# Patient Record
Sex: Male | Born: 1955 | Race: White | Hispanic: No | Marital: Married | Smoking: Former smoker
Health system: Southern US, Community
[De-identification: ages and names within clinical notes are randomized; demographics above are authoritative.]

## PROBLEM LIST (undated history)

## (undated) DIAGNOSIS — G8929 Other chronic pain: Secondary | ICD-10-CM

## (undated) DIAGNOSIS — M542 Cervicalgia: Secondary | ICD-10-CM

## (undated) DIAGNOSIS — F1910 Other psychoactive substance abuse, uncomplicated: Secondary | ICD-10-CM

## (undated) DIAGNOSIS — K5790 Diverticulosis of intestine, part unspecified, without perforation or abscess without bleeding: Secondary | ICD-10-CM

## (undated) DIAGNOSIS — K831 Obstruction of bile duct: Secondary | ICD-10-CM

## (undated) DIAGNOSIS — K4090 Unilateral inguinal hernia, without obstruction or gangrene, not specified as recurrent: Secondary | ICD-10-CM

## (undated) HISTORY — PX: TONSILLECTOMY AND ADENOIDECTOMY: SUR1326

## (undated) HISTORY — DX: Other psychoactive substance abuse, uncomplicated: F19.10

## (undated) HISTORY — DX: Cervicalgia: M54.2

## (undated) HISTORY — PX: INGUINAL HERNIA REPAIR: SUR1180

## (undated) HISTORY — PX: VASECTOMY: SHX75

## (undated) HISTORY — DX: Other chronic pain: G89.29

## (undated) HISTORY — DX: Obstruction of bile duct: K83.1

## (undated) HISTORY — DX: Unilateral inguinal hernia, without obstruction or gangrene, not specified as recurrent: K40.90

## (undated) HISTORY — DX: Diverticulosis of intestine, part unspecified, without perforation or abscess without bleeding: K57.90

---

## 2004-04-18 ENCOUNTER — Encounter: Admission: RE | Admit: 2004-04-18 | Discharge: 2004-04-18 | Payer: Self-pay | Admitting: Orthopedic Surgery

## 2004-05-31 ENCOUNTER — Encounter: Admission: RE | Admit: 2004-05-31 | Discharge: 2004-05-31 | Payer: Self-pay | Admitting: Orthopedic Surgery

## 2004-06-27 ENCOUNTER — Ambulatory Visit: Payer: Self-pay | Admitting: Internal Medicine

## 2006-09-24 ENCOUNTER — Ambulatory Visit: Payer: Self-pay | Admitting: Family Medicine

## 2006-09-24 LAB — CONVERTED CEMR LAB
ALT: 31 units/L (ref 0–40)
AST: 24 units/L (ref 0–37)
Albumin: 3.8 g/dL (ref 3.5–5.2)
Alkaline Phosphatase: 64 units/L (ref 39–117)
BUN: 26 mg/dL — ABNORMAL HIGH (ref 6–23)
Basophils Absolute: 0 10*3/uL (ref 0.0–0.1)
Basophils Relative: 0.5 % (ref 0.0–1.0)
Bilirubin, Direct: 0.1 mg/dL (ref 0.0–0.3)
CO2: 27 meq/L (ref 19–32)
Calcium: 9.1 mg/dL (ref 8.4–10.5)
Chloride: 110 meq/L (ref 96–112)
Cholesterol: 213 mg/dL (ref 0–200)
Creatinine, Ser: 1 mg/dL (ref 0.4–1.5)
Direct LDL: 147.8 mg/dL
Eosinophils Absolute: 0.3 10*3/uL (ref 0.0–0.6)
Eosinophils Relative: 3.9 % (ref 0.0–5.0)
GFR calc Af Amer: 102 mL/min
GFR calc non Af Amer: 84 mL/min
Glucose, Bld: 82 mg/dL (ref 70–99)
HCT: 44.7 % (ref 39.0–52.0)
HDL: 41.5 mg/dL (ref >39.0)
Hemoglobin: 15.6 g/dL (ref 13.0–17.0)
Lymphocytes Relative: 31.4 % (ref 12.0–46.0)
MCHC: 34.9 g/dL (ref 30.0–36.0)
MCV: 89.6 fL (ref 78.0–100.0)
Monocytes Absolute: 0.7 10*3/uL (ref 0.2–0.7)
Monocytes Relative: 10.6 % (ref 3.0–11.0)
Neutro Abs: 3.5 10*3/uL (ref 1.4–7.7)
Neutrophils Relative %: 53.6 % (ref 43.0–77.0)
PSA: 0.85 ng/mL (ref 0.10–4.00)
Platelets: 245 10*3/uL (ref 150–400)
Potassium: 4.2 meq/L (ref 3.5–5.1)
RBC: 4.99 M/uL (ref 4.22–5.81)
RDW: 12.5 % (ref 11.5–14.6)
Sodium: 145 meq/L (ref 135–145)
TSH: 0.96 microintl units/mL (ref 0.35–5.50)
Total Bilirubin: 0.7 mg/dL (ref 0.3–1.2)
Total CHOL/HDL Ratio: 5.1
Total Protein: 6.3 g/dL (ref 6.0–8.3)
Triglycerides: 118 mg/dL (ref 0–149)
VLDL: 24 mg/dL (ref 0–40)
WBC: 6.6 10*3/uL (ref 4.5–10.5)

## 2006-12-10 ENCOUNTER — Ambulatory Visit: Payer: Self-pay | Admitting: Family Medicine

## 2007-08-06 ENCOUNTER — Ambulatory Visit: Payer: Self-pay | Admitting: Family Medicine

## 2007-08-06 DIAGNOSIS — J209 Acute bronchitis, unspecified: Secondary | ICD-10-CM

## 2008-06-03 ENCOUNTER — Ambulatory Visit: Payer: Self-pay | Admitting: Family Medicine

## 2008-06-05 LAB — CONVERTED CEMR LAB
ALT: 31 units/L (ref 0–53)
AST: 21 units/L (ref 0–37)
Albumin: 3.8 g/dL (ref 3.5–5.2)
Alkaline Phosphatase: 65 units/L (ref 39–117)
BUN: 21 mg/dL (ref 6–23)
Basophils Absolute: 0 10*3/uL (ref 0.0–0.1)
Basophils Relative: 0.4 % (ref 0.0–3.0)
Bilirubin, Direct: 0.1 mg/dL (ref 0.0–0.3)
CO2: 29 meq/L (ref 19–32)
Calcium: 9 mg/dL (ref 8.4–10.5)
Chloride: 109 meq/L (ref 96–112)
Cholesterol: 210 mg/dL (ref 0–200)
Creatinine, Ser: 1 mg/dL (ref 0.4–1.5)
Direct LDL: 134.7 mg/dL
Eosinophils Absolute: 0.1 10*3/uL (ref 0.0–0.7)
Eosinophils Relative: 2.3 % (ref 0.0–5.0)
GFR calc Af Amer: 101 mL/min
GFR calc non Af Amer: 83 mL/min
Glucose, Bld: 85 mg/dL (ref 70–99)
HCT: 48.2 % (ref 39.0–52.0)
HDL: 34 mg/dL — ABNORMAL LOW (ref >39.0)
Hemoglobin: 16.7 g/dL (ref 13.0–17.0)
Lymphocytes Relative: 31.5 % (ref 12.0–46.0)
MCHC: 34.7 g/dL (ref 30.0–36.0)
MCV: 91.6 fL (ref 78.0–100.0)
Monocytes Absolute: 0.7 10*3/uL (ref 0.1–1.0)
Monocytes Relative: 10.9 % (ref 3.0–12.0)
Neutro Abs: 3.4 10*3/uL (ref 1.4–7.7)
Neutrophils Relative %: 54.9 % (ref 43.0–77.0)
PSA: 0.69 ng/mL (ref 0.10–4.00)
Platelets: 262 10*3/uL (ref 150–400)
Potassium: 4.4 meq/L (ref 3.5–5.1)
RBC: 5.26 M/uL (ref 4.22–5.81)
RDW: 12.5 % (ref 11.5–14.6)
Sodium: 144 meq/L (ref 135–145)
TSH: 0.78 microintl units/mL (ref 0.35–5.50)
Total Bilirubin: 0.8 mg/dL (ref 0.3–1.2)
Total CHOL/HDL Ratio: 6.2
Total Protein: 6.7 g/dL (ref 6.0–8.3)
Triglycerides: 137 mg/dL (ref 0–149)
VLDL: 27 mg/dL (ref 0–40)
WBC: 6.2 10*3/uL (ref 4.5–10.5)

## 2008-07-10 ENCOUNTER — Ambulatory Visit: Payer: Self-pay | Admitting: Internal Medicine

## 2008-08-11 ENCOUNTER — Ambulatory Visit: Payer: Self-pay | Admitting: Internal Medicine

## 2008-08-11 DIAGNOSIS — K5790 Diverticulosis of intestine, part unspecified, without perforation or abscess without bleeding: Secondary | ICD-10-CM

## 2008-08-11 HISTORY — DX: Diverticulosis of intestine, part unspecified, without perforation or abscess without bleeding: K57.90

## 2008-08-11 HISTORY — PX: COLONOSCOPY: SHX174

## 2008-10-20 ENCOUNTER — Telehealth: Payer: Self-pay | Admitting: Family Medicine

## 2009-01-06 ENCOUNTER — Telehealth: Payer: Self-pay | Admitting: Family Medicine

## 2009-03-12 ENCOUNTER — Telehealth: Payer: Self-pay | Admitting: Family Medicine

## 2009-06-17 ENCOUNTER — Telehealth: Payer: Self-pay | Admitting: Family Medicine

## 2009-06-21 ENCOUNTER — Telehealth: Payer: Self-pay | Admitting: Family Medicine

## 2009-06-22 ENCOUNTER — Ambulatory Visit: Payer: Self-pay | Admitting: Family Medicine

## 2009-07-26 ENCOUNTER — Telehealth: Payer: Self-pay | Admitting: Family Medicine

## 2009-07-29 ENCOUNTER — Telehealth: Payer: Self-pay | Admitting: Family Medicine

## 2009-10-28 ENCOUNTER — Ambulatory Visit: Payer: Self-pay | Admitting: Family Medicine

## 2010-09-08 NOTE — Assessment & Plan Note (Signed)
Summary: sda//ccm   Vital Signs:  Patient profile:   55 year old male Temp:     98.7 degrees F oral BP sitting:   130 / 88  (left arm) Cuff size:   large  Vitals Entered By: Sid Falcon LPN (October 28, 2009 2:55 PM) CC: ? bronchitis, Cough   History of Present Illness:  Cough      This is a 55 year old man who presents with Cough.  smoker with few day hx of prod cough.  The patient reports productive cough, but denies pleuritic chest pain, shortness of breath, fever, and hemoptysis.  The patient denies the following symptoms: cold/URI symptoms, sore throat, weight loss, acid reflux symptoms, and peripheral edema.  Ineffective prior treatments have included OTC cough medication and albuterol inhaler.  Diagnostic testing to date has included CXR-recently 11/10 unremarkable and symptoms fully cleared after that treatment.  Allergies: 1)  ! Pcn 2)  ! Codeine  Past History:  Past Medical History: Last updated: 06/03/2008 Alcohol abuse, stopped in 1998 chronic neck pain PMH reviewed for relevance  Review of Systems  The patient denies anorexia, fever, weight loss, chest pain, dyspnea on exertion, peripheral edema, prolonged cough, and hemoptysis.    Physical Exam  General:  Well-developed,well-nourished,in no acute distress; alert,appropriate and cooperative throughout examination Mouth:  Oral mucosa and oropharynx without lesions or exudates.  Teeth in good repair. Neck:  No deformities, masses, or tenderness noted. Lungs:  faint wheezes.  No rales.  Symmetric breath sounds. Heart:  Normal rate and regular rhythm. S1 and S2 normal without gallop, murmur, click, rub or other extra sounds.   Impression & Recommendations:  Problem # 1:  ACUTE BRONCHITIS (ICD-466.0) pt encouraged to stop smoking. His updated medication list for this problem includes:    Proair Hfa 108 (90 Base) Mcg/act Aers (Albuterol sulfate) .Marland Kitchen... 2 puffs every 4 hours as needed    Levaquin 500 Mg Tabs  (Levofloxacin) ..... One by mouth once daily for 7 days  Complete Medication List: 1)  Proair Hfa 108 (90 Base) Mcg/act Aers (Albuterol sulfate) .... 2 puffs every 4 hours as needed 2)  Flexeril 10 Mg Tabs (Cyclobenzaprine hcl) .Marland Kitchen.. 1 by mouth three times a day as needed for spasm 3)  Hydrocodone-acetaminophen 7.5-500 Mg Tabs (Hydrocodone-acetaminophen) .Marland Kitchen.. 1 by mouth every 6 hours as needed for pain 4)  Promethazine Hcl 25 Mg Tabs (Promethazine hcl) .Marland Kitchen.. 1 q 4 hours as needed nausea 5)  Levaquin 500 Mg Tabs (Levofloxacin) .... One by mouth once daily for 7 days  Patient Instructions: 1)  Acute Bronchitis symptoms for less then 10 days are not  helped by antibiotics. Take over the counter cough medications. Call if no improvement in 5-7 days, sooner if increasing cough, fever, or new symptoms ( shortness of breath, chest pain) .  Prescriptions: LEVAQUIN 500 MG TABS (LEVOFLOXACIN) one by mouth once daily for 7 days  #7 x 0   Entered and Authorized by:   Evelena Peat MD   Signed by:   Evelena Peat MD on 10/28/2009   Method used:   Electronically to        [facility] [address] [phone]* (retail)       [address]       [place], [place]  [address]       Ph: [phone] or [phone]       Fax: [phone]   RxID:   [id number]

## 2010-11-22 ENCOUNTER — Ambulatory Visit (INDEPENDENT_AMBULATORY_CARE_PROVIDER_SITE_OTHER): Payer: Medicare HMO | Admitting: Family Medicine

## 2010-11-22 ENCOUNTER — Encounter: Payer: Self-pay | Admitting: Family Medicine

## 2010-11-22 VITALS — BP 140/94 | HR 104

## 2010-11-22 DIAGNOSIS — R5381 Other malaise: Secondary | ICD-10-CM

## 2010-11-22 DIAGNOSIS — R5383 Other fatigue: Secondary | ICD-10-CM

## 2010-11-22 NOTE — Progress Notes (Signed)
  Subjective:    Patient ID: Terry Byrd, male    DOB: February 13, 1956, 55 y.o.   MRN: [id number]  HPI Here with concerns over fatigue. He started noticing some mild fatigue about 6 months ago. He sleeps well, and averages 7-8 hours a night. At that time he started to have to get up to urinate 1-2 times a night, but there was no discomfort. Then about one month ago the fatigue became more of an issue. He finds it hard to get out of bed in the morning, and he is always tired. No specific problems otherwise. His weight is stable. He gets no exercise at all. His last cpx here with labs was 2 and 1/2 years ago. He is under a lot of stress on his job.    Review of Systems  Constitutional: Positive for fatigue.  Eyes: Negative.   Respiratory: Negative.   Cardiovascular: Negative.   Gastrointestinal: Negative.   Neurological: Negative.        Objective:   Physical Exam  Constitutional: He is oriented to person, place, and time. He appears well-developed and well-nourished.  Neck: No thyromegaly present.  Cardiovascular: Normal rate, regular rhythm, normal heart sounds and intact distal pulses.   Pulmonary/Chest: Effort normal and breath sounds normal.  Lymphadenopathy:    He has no cervical adenopathy.  Neurological: He is alert and oriented to person, place, and time. He exhibits normal muscle tone. Coordination normal.          Assessment & Plan:  This could be from many potential sources, including simple stress and lack of conditioning. He will set up a cpx with labs in the near future.

## 2010-11-23 ENCOUNTER — Other Ambulatory Visit: Payer: Self-pay | Admitting: Family Medicine

## 2010-11-29 ENCOUNTER — Other Ambulatory Visit (INDEPENDENT_AMBULATORY_CARE_PROVIDER_SITE_OTHER): Payer: Medicare HMO

## 2010-11-29 ENCOUNTER — Other Ambulatory Visit: Payer: Self-pay | Admitting: Family Medicine

## 2010-11-29 DIAGNOSIS — R5381 Other malaise: Secondary | ICD-10-CM

## 2010-11-29 DIAGNOSIS — Z0000 Encounter for general adult medical examination without abnormal findings: Secondary | ICD-10-CM

## 2010-11-29 DIAGNOSIS — Z79899 Other long term (current) drug therapy: Secondary | ICD-10-CM

## 2010-11-29 LAB — CBC WITH DIFFERENTIAL/PLATELET
Basophils Absolute: 0 10*3/uL (ref 0.0–0.1)
Basophils Relative: 0.5 % (ref 0.0–3.0)
Eosinophils Absolute: 0.1 10*3/uL (ref 0.0–0.7)
Eosinophils Relative: 1.5 % (ref 0.0–5.0)
HCT: 48.5 % (ref 39.0–52.0)
Hemoglobin: 16.6 g/dL (ref 13.0–17.0)
Lymphocytes Relative: 34 % (ref 12.0–46.0)
Lymphs Abs: 2.9 10*3/uL (ref 0.7–4.0)
MCHC: 34.2 g/dL (ref 30.0–36.0)
MCV: 91.4 fl (ref 78.0–100.0)
Monocytes Absolute: 0.9 10*3/uL (ref 0.1–1.0)
Monocytes Relative: 10.1 % (ref 3.0–12.0)
Neutro Abs: 4.6 10*3/uL (ref 1.4–7.7)
Neutrophils Relative %: 53.9 % (ref 43.0–77.0)
Platelets: 264 10*3/uL (ref 150.0–400.0)
RBC: 5.31 Mil/uL (ref 4.22–5.81)
RDW: 13.3 % (ref 11.5–14.6)
WBC: 8.6 10*3/uL (ref 4.5–10.5)

## 2010-11-29 LAB — HEPATIC FUNCTION PANEL
ALT: 32 U/L (ref 0–53)
AST: 25 U/L (ref 0–37)
Albumin: 3.8 g/dL (ref 3.5–5.2)
Alkaline Phosphatase: 67 U/L (ref 39–117)
Bilirubin, Direct: 0.1 mg/dL (ref 0.0–0.3)
Total Bilirubin: 0.4 mg/dL (ref 0.3–1.2)
Total Protein: 6.5 g/dL (ref 6.0–8.3)

## 2010-11-29 LAB — TSH: TSH: 1.51 u[IU]/mL (ref 0.35–5.50)

## 2010-11-29 LAB — URINALYSIS
Bilirubin Urine: NEGATIVE
Hgb urine dipstick: NEGATIVE
Ketones, ur: NEGATIVE
Leukocytes, UA: NEGATIVE
Nitrite: NEGATIVE
Specific Gravity, Urine: 1.025 (ref 1.000–1.030)
Total Protein, Urine: NEGATIVE
Urine Glucose: NEGATIVE
Urobilinogen, UA: 0.2 (ref 0.0–1.0)
pH: 5.5 (ref 5.0–8.0)

## 2010-11-29 LAB — BASIC METABOLIC PANEL
BUN: 20 mg/dL (ref 6–23)
CO2: 28 mEq/L (ref 19–32)
Calcium: 9.5 mg/dL (ref 8.4–10.5)
Chloride: 106 mEq/L (ref 96–112)
Creatinine, Ser: 1.3 mg/dL (ref 0.4–1.5)
GFR: 58.87 mL/min — ABNORMAL LOW (ref >60.00)
Glucose, Bld: 89 mg/dL (ref 70–99)
Potassium: 4.9 mEq/L (ref 3.5–5.1)
Sodium: 143 mEq/L (ref 135–145)

## 2010-11-29 LAB — HEMOGLOBIN A1C: Hgb A1c MFr Bld: 5.8 % (ref 4.6–6.5)

## 2010-11-29 LAB — LIPID PANEL
Cholesterol: 194 mg/dL (ref 0–200)
HDL: 40.4 mg/dL (ref >39.00)
LDL Cholesterol: 123 mg/dL — ABNORMAL HIGH (ref 0–99)
Total CHOL/HDL Ratio: 5
Triglycerides: 152 mg/dL — ABNORMAL HIGH (ref 0.0–149.0)
VLDL: 30.4 mg/dL (ref 0.0–40.0)

## 2010-11-29 NOTE — Progress Notes (Signed)
LMOM to inform Pt. 

## 2010-12-06 ENCOUNTER — Encounter: Payer: Self-pay | Admitting: Family Medicine

## 2010-12-06 ENCOUNTER — Ambulatory Visit (INDEPENDENT_AMBULATORY_CARE_PROVIDER_SITE_OTHER): Payer: Medicare HMO | Admitting: Family Medicine

## 2010-12-06 VITALS — BP 122/84 | HR 96 | Temp 98.0°F | Resp 14 | Ht 69.75 in | Wt 209.0 lb

## 2010-12-06 DIAGNOSIS — Z0000 Encounter for general adult medical examination without abnormal findings: Secondary | ICD-10-CM

## 2010-12-06 DIAGNOSIS — Z136 Encounter for screening for cardiovascular disorders: Secondary | ICD-10-CM

## 2010-12-06 NOTE — Progress Notes (Signed)
  Subjective:    Patient ID: Terry Byrd, male    DOB: 1956/06/07, 55 y.o.   MRN: [id number]  HPI 55 yr old male for a cpx. He has no specific concerns. We spoke a few weeks ago about some generalized fatigue, and I suggested that he get moe exercise. In fact, he has started walking every day, and he already feels better.    Review of Systems  Constitutional: Negative.   HENT: Negative.   Eyes: Negative.   Respiratory: Negative.   Cardiovascular: Negative.   Gastrointestinal: Negative.   Genitourinary: Negative.   Musculoskeletal: Negative.   Skin: Negative.   Neurological: Negative.   Hematological: Negative.   Psychiatric/Behavioral: Negative.        Objective:   Physical Exam  Constitutional: He is oriented to person, place, and time. He appears well-developed and well-nourished. No distress.  HENT:  Head: Normocephalic and atraumatic.  Right Ear: External ear normal.  Left Ear: External ear normal.  Nose: Nose normal.  Mouth/Throat: Oropharynx is clear and moist. No oropharyngeal exudate.  Eyes: Conjunctivae and EOM are normal. Pupils are equal, round, and reactive to light. Right eye exhibits no discharge. Left eye exhibits no discharge. No scleral icterus.  Neck: Neck supple. No JVD present. No tracheal deviation present. No thyromegaly present.  Cardiovascular: Normal rate, regular rhythm, normal heart sounds and intact distal pulses.  Exam reveals no gallop and no friction rub.   No murmur heard.      EKG normal   Pulmonary/Chest: Effort normal and breath sounds normal. No respiratory distress. He has no wheezes. He has no rales. He exhibits no tenderness.  Abdominal: Soft. Bowel sounds are normal. He exhibits no distension and no mass. There is no tenderness. There is no rebound and no guarding.  Genitourinary: Rectum normal, prostate normal and penis normal. Guaiac negative stool. No penile tenderness.  Musculoskeletal: Normal range of motion. He exhibits no edema and  no tenderness.  Lymphadenopathy:    He has no cervical adenopathy.  Neurological: He is alert and oriented to person, place, and time. He has normal reflexes. No cranial nerve deficit. He exhibits normal muscle tone. Coordination normal.  Skin: Skin is warm and dry. No rash noted. He is not diaphoretic. No erythema. No pallor.  Psychiatric: He has a normal mood and affect. His behavior is normal. Judgment and thought content normal.          Assessment & Plan:  He seems to be doing well. Continue exercise. He needs to have a PSA drawn soon.

## 2010-12-23 NOTE — Assessment & Plan Note (Signed)
HEALTHCARE                            [facility] OFFICE NOTE   NAME:Terry Byrd, Terry Byrd                       MRN:          [id number]  DATE:09/24/2006                            DOB:          [birth year]    This is a 55 year old gentleman here to establish with our practice and  he is also for a complete physical exam. His last physical was a few  years ago. He had been seeing Dr. Antionette Char at urgent care for various  problems but has not had a primary care physician in many years.  Currently he feels fine except for some chronic neck pain. He was  diagnosed with cervical disc disease at multiple levels some years ago.  His last MRI scan was about 2 or 3 years ago. He saw Dr. Lajoyce Corners about it  at 1 point and then saw Dr. Barrington Ellison about it. He tried different types of  pain medications which helped somewhat. He had used Lodine in the past  and this helped somewhat. He even had some facet joint injections  performed which were not particularly effective. He describes daily  stiffness and pain especially in the left side of the posterior neck,  which radiates towards the left shoulder blade sometimes he experiences  pain down the left arm and numbness down the left arm. There has been no  history of trauma.   OTHER PAST MEDICAL HISTORY:  He had a right inguinal hernia repair when  he was 55 years old, he has had a vasectomy. He considers himself to have  had a problem with alcohol use in past years and actually quit drinking  all forms of alcohol in 1998. He was hospitalized for several days in  February of 1998 with rhabdomyolysis, this was after getting dehydrated  and having several intense weight lifting sessions. After intense IV  hydration and close monitoring he recovered without resorting to  dialysis, fortunately.   ALLERGIES:  PENICILLIN AND CODEINE.   CURRENT MEDICATIONS:  None by prescription, he does average 4 to 6 BC  powders everyday to  control his neck pain.   HABITS:  He does not use alcohol. He does smoke cigarettes.   SOCIAL HISTORY:  He is married with children, he works in Engineer, petroleum.   FAMILY HISTORY:  Remarkable for colon cancer in his father, also  diabetes. He did have a screening colonoscopy per Dr. Vida Rigger about 5  years ago, this was reportedly normal.   OBJECTIVE:  Height 5 feet 10 inches, weight 212, blood pressure 130/82,  pulse 68 and regular.  GENERAL: He is a little overweight.  SKIN: Clear.  EYES: Clear.  PHARYNX: Clear.  NECK: Supple without lymphadenopathy or masses, there is no particular  spasm. He is mildly tender in the left posterior neck region.  LUNGS: Clear.  CARDIAC: Rate is regular, rhythm is regular with an occasional ectopic  beat, there are no gallops, murmurs, or rubs, distal pulses are full.  EKG within normal limits, except for a single PVC.  ABDOMEN: Soft, normal bowel sounds, nontender, no masses.  GENITALIA:  Normal male, he is circumcised.  RECTAL EXAM: No mass, or tenderness, prostate is within normal limits,  stool Hemoccult negative.  EXTREMITIES: No clubbing, cyanosis, or edema.  NEUROLOGICAL EXAM: Grossly intact.   ASSESSMENT/PLAN:  1. Complete physical.  We talked about losing a little bit of weight.      He is fasting this morning so we will get the usual laboratories.      We will also set him up another colonoscopy with Dr. Ewing Schlein.  2. Neck pain.  We will  try Celebrex 200 mg b.i.d. He will follow up      as needed.     Tera Mater. Clent Ridges, MD  Electronically Signed    SAF/MedQ  DD: 09/24/2006  DT: 09/24/2006  Job #: [id number]

## 2011-01-21 ENCOUNTER — Other Ambulatory Visit: Payer: Self-pay | Admitting: Family Medicine

## 2011-03-14 ENCOUNTER — Other Ambulatory Visit: Payer: Self-pay | Admitting: Family Medicine

## 2011-03-14 NOTE — Telephone Encounter (Signed)
Last seen 12/06/10  Last written 01/21/11 #60  0RF Please advise

## 2011-03-14 NOTE — Telephone Encounter (Signed)
Refill Cyclobenzaprine at [facility]. Thanks.

## 2011-03-14 NOTE — Telephone Encounter (Signed)
duplicate

## 2011-05-08 ENCOUNTER — Other Ambulatory Visit: Payer: Self-pay | Admitting: Family Medicine

## 2011-05-09 NOTE — Telephone Encounter (Signed)
Script sent e-scribe 

## 2011-06-02 ENCOUNTER — Telehealth: Payer: Self-pay | Admitting: Family Medicine

## 2011-06-02 NOTE — Telephone Encounter (Signed)
Works to be worked in today with congestion. Please advise and return his call today. I offered the Saturday clinic but they refused. Thanks.

## 2011-06-05 NOTE — Telephone Encounter (Signed)
We could see him on Monday

## 2011-08-10 ENCOUNTER — Ambulatory Visit (INDEPENDENT_AMBULATORY_CARE_PROVIDER_SITE_OTHER): Payer: Medicare HMO | Admitting: Psychology

## 2011-08-10 DIAGNOSIS — F411 Generalized anxiety disorder: Secondary | ICD-10-CM

## 2011-08-24 ENCOUNTER — Ambulatory Visit (INDEPENDENT_AMBULATORY_CARE_PROVIDER_SITE_OTHER): Payer: Medicare HMO | Admitting: Psychology

## 2011-08-24 DIAGNOSIS — F411 Generalized anxiety disorder: Secondary | ICD-10-CM

## 2011-12-06 ENCOUNTER — Telehealth: Payer: Self-pay | Admitting: *Deleted

## 2011-12-06 ENCOUNTER — Encounter: Payer: Self-pay | Admitting: Family Medicine

## 2011-12-06 ENCOUNTER — Ambulatory Visit (INDEPENDENT_AMBULATORY_CARE_PROVIDER_SITE_OTHER): Payer: Medicare HMO | Admitting: Family Medicine

## 2011-12-06 VITALS — BP 120/86 | HR 98 | Temp 98.5°F | Wt 218.0 lb

## 2011-12-06 DIAGNOSIS — M542 Cervicalgia: Secondary | ICD-10-CM

## 2011-12-06 DIAGNOSIS — M546 Pain in thoracic spine: Secondary | ICD-10-CM | POA: Insufficient documentation

## 2011-12-06 MED ORDER — HYDROCODONE-ACETAMINOPHEN 10-325 MG PO TABS: 1.0000 | ORAL_TABLET | Freq: Four times a day (QID) | ORAL | Status: AC | PRN

## 2011-12-06 MED ORDER — PREDNISONE (PAK) 10 MG PO TABS: ORAL_TABLET | ORAL | Status: DC

## 2011-12-06 NOTE — Progress Notes (Signed)
  Subjective:    Patient ID: Terry Byrd, male    DOB: 12/14/1955, 56 y.o.   MRN: [id number]  HPI Here for 3 days of sharp pains that originate in the left upper back, then shoot around under the shoulder blade to the left axcilla. No recent trauma. No SOB or sweats or nausea. Using Lortab and Motrin at home.    Review of Systems  Constitutional: Negative.   Respiratory: Negative.   Cardiovascular: Negative.   Musculoskeletal: Positive for back pain.       Objective:   Physical Exam  Constitutional: He appears well-developed and well-nourished.       In pain   Cardiovascular: Normal rate, regular rhythm, normal heart sounds and intact distal pulses.   Pulmonary/Chest: Effort normal and breath sounds normal.  Musculoskeletal:       Tender in the left upper back between the spine and the left scapula with some muscle spasms           Assessment & Plan:  Use heat, get a massage. Use Flexeril, Vicodin prn, and a prednisone taper pack. Recheck prn

## 2011-12-06 NOTE — Telephone Encounter (Signed)
Dr Clent Ridges made aware.

## 2011-12-06 NOTE — Telephone Encounter (Signed)
Pt is complaining of mid back pain that radiates to chest.  No SOB, sweating, or any other cardiac symptoms, and wants to come to the office NOT the ER.  Will come this afternoon to see Dr. Clent Ridges.

## 2011-12-19 ENCOUNTER — Telehealth: Payer: Self-pay | Admitting: Family Medicine

## 2011-12-19 NOTE — Telephone Encounter (Signed)
We could refer him for PT for a few weeks if he wishes

## 2011-12-19 NOTE — Telephone Encounter (Signed)
Pt said that pain in back has improved some, but is still there. Pt said that trigger point is swollen. Dr Clent Ridges wanted pt to call and give update. Pt wants to know what to do next.

## 2011-12-20 NOTE — Telephone Encounter (Signed)
I spoke with pt and he declined to get the referral.

## 2012-04-17 ENCOUNTER — Other Ambulatory Visit: Payer: Self-pay | Admitting: Family Medicine

## 2012-06-25 ENCOUNTER — Other Ambulatory Visit: Payer: Self-pay | Admitting: Family Medicine

## 2012-06-26 NOTE — Telephone Encounter (Signed)
Call in #60 with 2 rf 

## 2012-09-04 ENCOUNTER — Other Ambulatory Visit (INDEPENDENT_AMBULATORY_CARE_PROVIDER_SITE_OTHER): Payer: Managed Care, Other (non HMO)

## 2012-09-04 DIAGNOSIS — Z0000 Encounter for general adult medical examination without abnormal findings: Secondary | ICD-10-CM

## 2012-09-04 LAB — BASIC METABOLIC PANEL
BUN: 22 mg/dL (ref 6–23)
CO2: 28 mEq/L (ref 19–32)
Calcium: 9.1 mg/dL (ref 8.4–10.5)
Chloride: 104 mEq/L (ref 96–112)
Creatinine, Ser: 1 mg/dL (ref 0.4–1.5)
GFR: 79.25 mL/min (ref >60.00)
Glucose, Bld: 93 mg/dL (ref 70–99)
Potassium: 4.7 mEq/L (ref 3.5–5.1)
Sodium: 139 mEq/L (ref 135–145)

## 2012-09-04 LAB — CBC WITH DIFFERENTIAL/PLATELET
Basophils Absolute: 0 10*3/uL (ref 0.0–0.1)
Basophils Relative: 0.6 % (ref 0.0–3.0)
Eosinophils Absolute: 0.1 10*3/uL (ref 0.0–0.7)
Eosinophils Relative: 2.1 % (ref 0.0–5.0)
HCT: 47.8 % (ref 39.0–52.0)
Hemoglobin: 16.2 g/dL (ref 13.0–17.0)
Lymphocytes Relative: 33.5 % (ref 12.0–46.0)
Lymphs Abs: 2.3 10*3/uL (ref 0.7–4.0)
MCHC: 33.9 g/dL (ref 30.0–36.0)
MCV: 88.8 fl (ref 78.0–100.0)
Monocytes Absolute: 0.6 10*3/uL (ref 0.1–1.0)
Monocytes Relative: 9.1 % (ref 3.0–12.0)
Neutro Abs: 3.8 10*3/uL (ref 1.4–7.7)
Neutrophils Relative %: 54.7 % (ref 43.0–77.0)
Platelets: 258 10*3/uL (ref 150.0–400.0)
RBC: 5.38 Mil/uL (ref 4.22–5.81)
RDW: 13.1 % (ref 11.5–14.6)
WBC: 7 10*3/uL (ref 4.5–10.5)

## 2012-09-04 LAB — TSH: TSH: 0.86 u[IU]/mL (ref 0.35–5.50)

## 2012-09-04 LAB — HEPATIC FUNCTION PANEL
ALT: 35 U/L (ref 0–53)
AST: 24 U/L (ref 0–37)
Albumin: 3.9 g/dL (ref 3.5–5.2)
Alkaline Phosphatase: 79 U/L (ref 39–117)
Bilirubin, Direct: 0 mg/dL (ref 0.0–0.3)
Total Bilirubin: 0.6 mg/dL (ref 0.3–1.2)
Total Protein: 6.8 g/dL (ref 6.0–8.3)

## 2012-09-04 LAB — POCT URINALYSIS DIPSTICK
Bilirubin, UA: NEGATIVE
Blood, UA: NEGATIVE
Glucose, UA: NEGATIVE
Ketones, UA: NEGATIVE
Leukocytes, UA: NEGATIVE
Nitrite, UA: NEGATIVE
Protein, UA: NEGATIVE
Spec Grav, UA: 1.02
Urobilinogen, UA: 0.2
pH, UA: 6

## 2012-09-04 LAB — LIPID PANEL
Cholesterol: 189 mg/dL (ref 0–200)
HDL: 39.7 mg/dL (ref >39.00)
LDL Cholesterol: 126 mg/dL — ABNORMAL HIGH (ref 0–99)
Total CHOL/HDL Ratio: 5
Triglycerides: 118 mg/dL (ref 0.0–149.0)
VLDL: 23.6 mg/dL (ref 0.0–40.0)

## 2012-09-04 LAB — PSA: PSA: 0.77 ng/mL (ref 0.10–4.00)

## 2012-09-09 NOTE — Progress Notes (Signed)
Quick Note:  Pt has CPE on 09/10/12 will go over then. ______

## 2012-09-10 ENCOUNTER — Encounter: Payer: Self-pay | Admitting: Family Medicine

## 2012-09-10 ENCOUNTER — Ambulatory Visit (INDEPENDENT_AMBULATORY_CARE_PROVIDER_SITE_OTHER): Payer: Managed Care, Other (non HMO) | Admitting: Family Medicine

## 2012-09-10 VITALS — BP 142/92 | HR 106 | Temp 98.5°F | Ht 69.5 in | Wt 221.0 lb

## 2012-09-10 DIAGNOSIS — Z0000 Encounter for general adult medical examination without abnormal findings: Secondary | ICD-10-CM

## 2012-09-10 MED ORDER — TAMSULOSIN HCL 0.4 MG PO CAPS: 0.4000 mg | ORAL_CAPSULE | Freq: Every day | ORAL | Status: DC

## 2012-09-10 MED ORDER — SILDENAFIL CITRATE 100 MG PO TABS: 100.0000 mg | ORAL_TABLET | ORAL | Status: DC | PRN

## 2012-09-10 NOTE — Progress Notes (Signed)
  Subjective:    Patient ID: Terry Byrd, male    DOB: 08/14/1955, 57 y.o.   MRN: [id number]  HPI 57 yr old male for a cpx. He has a few issues to discuss. He has had some erectile problems in the past year and asks to try some Viagra. No change in his libido. Also he has to get up to urinate 3-4 times every night. He has put on some weight in the past year. He lefts weights in the gym one night a week but he does not types of cardio workouts.    Review of Systems  Constitutional: Negative.   HENT: Negative.   Eyes: Negative.   Respiratory: Negative.   Cardiovascular: Negative.   Gastrointestinal: Negative.   Genitourinary: Negative.   Musculoskeletal: Negative.   Skin: Negative.   Neurological: Negative.   Hematological: Negative.   Psychiatric/Behavioral: Negative.        Objective:   Physical Exam  Constitutional: He is oriented to person, place, and time. He appears well-developed and well-nourished. No distress.  HENT:  Head: Normocephalic and atraumatic.  Right Ear: External ear normal.  Left Ear: External ear normal.  Nose: Nose normal.  Mouth/Throat: Oropharynx is clear and moist. No oropharyngeal exudate.  Eyes: Conjunctivae normal and EOM are normal. Pupils are equal, round, and reactive to light. Right eye exhibits no discharge. Left eye exhibits no discharge. No scleral icterus.  Neck: Neck supple. No JVD present. No tracheal deviation present. No thyromegaly present.  Cardiovascular: Normal rate, regular rhythm, normal heart sounds and intact distal pulses.  Exam reveals no gallop and no friction rub.   No murmur heard.      EKG normal   Pulmonary/Chest: Effort normal and breath sounds normal. No respiratory distress. He has no wheezes. He has no rales. He exhibits no tenderness.  Abdominal: Soft. Bowel sounds are normal. He exhibits no distension and no mass. There is no tenderness. There is no rebound and no guarding.  Genitourinary: Rectum normal, prostate  normal and penis normal. Guaiac negative stool. No penile tenderness.  Musculoskeletal: Normal range of motion. He exhibits no edema and no tenderness.  Lymphadenopathy:    He has no cervical adenopathy.  Neurological: He is alert and oriented to person, place, and time. He has normal reflexes. No cranial nerve deficit. He exhibits normal muscle tone. Coordination normal.  Skin: Skin is warm and dry. No rash noted. He is not diaphoretic. No erythema. No pallor.  Psychiatric: He has a normal mood and affect. His behavior is normal. Judgment and thought content normal.          Assessment & Plan:  Well exam. He has gained some weight and his BP has elevated a bit. He needs to lose weight and he needs to get more cardiovascular exercise. Try Viagra prn. Start on Flomax for the BPH symptoms.

## 2013-05-02 ENCOUNTER — Encounter: Payer: Self-pay | Admitting: Family Medicine

## 2013-05-02 ENCOUNTER — Ambulatory Visit (INDEPENDENT_AMBULATORY_CARE_PROVIDER_SITE_OTHER): Payer: Managed Care, Other (non HMO) | Admitting: Family Medicine

## 2013-05-02 VITALS — BP 150/88 | HR 96 | Temp 98.1°F | Wt 220.0 lb

## 2013-05-02 DIAGNOSIS — K5732 Diverticulitis of large intestine without perforation or abscess without bleeding: Secondary | ICD-10-CM

## 2013-05-02 MED ORDER — CIPROFLOXACIN HCL 500 MG PO TABS: 500.0000 mg | ORAL_TABLET | Freq: Two times a day (BID) | ORAL | Status: DC

## 2013-05-04 ENCOUNTER — Encounter: Payer: Self-pay | Admitting: Family Medicine

## 2013-05-04 NOTE — Progress Notes (Signed)
  Subjective:    Patient ID: Terry Byrd, male    DOB: 1956-07-11, 57 y.o.   MRN: [id number]  HPI Here for 7 days of intermittent sharp pains in the lower abdomen with bloating, belching, and constipation. His last BM was 3 days ago and it was harder then normal. No nausea or fever. No urinary sx.    Review of Systems  Constitutional: Negative.   Gastrointestinal: Positive for abdominal pain and constipation. Negative for nausea, vomiting, diarrhea, blood in stool, abdominal distention, anal bleeding and rectal pain.  Genitourinary: Negative.        Objective:   Physical Exam  Constitutional: He appears well-developed and well-nourished. No distress.  Cardiovascular: Normal rate, regular rhythm, normal heart sounds and intact distal pulses.   Pulmonary/Chest: Effort normal and breath sounds normal.  Abdominal: Soft. Bowel sounds are normal. He exhibits no distension and no mass. There is no rebound and no guarding.  Moderately tender in the LLQ           Assessment & Plan:  This is his first episode of diverticulitis. Treat with Cipro. Recheck prn

## 2013-05-06 ENCOUNTER — Telehealth: Payer: Self-pay | Admitting: Family Medicine

## 2013-05-06 NOTE — Telephone Encounter (Signed)
Pt wife gail is requesting sylvia to return her call concerning her hus last appt with MD.

## 2013-05-07 NOTE — Telephone Encounter (Signed)
I spoke with Terry Byrd and pt is doing much better.

## 2013-05-12 ENCOUNTER — Ambulatory Visit (INDEPENDENT_AMBULATORY_CARE_PROVIDER_SITE_OTHER)
Admission: RE | Admit: 2013-05-12 | Discharge: 2013-05-12 | Disposition: A | Discharge status: Home / Self Care | Payer: Managed Care, Other (non HMO) | Source: Ambulatory Visit | Attending: Family | Admitting: Family

## 2013-05-12 ENCOUNTER — Ambulatory Visit (INDEPENDENT_AMBULATORY_CARE_PROVIDER_SITE_OTHER): Payer: Managed Care, Other (non HMO) | Admitting: Family

## 2013-05-12 ENCOUNTER — Telehealth: Payer: Self-pay | Admitting: Family Medicine

## 2013-05-12 ENCOUNTER — Encounter: Payer: Self-pay | Admitting: Family

## 2013-05-12 VITALS — BP 104/80 | Temp 97.9°F | Wt 215.0 lb

## 2013-05-12 DIAGNOSIS — R109 Unspecified abdominal pain: Secondary | ICD-10-CM

## 2013-05-12 DIAGNOSIS — R319 Hematuria, unspecified: Secondary | ICD-10-CM

## 2013-05-12 DIAGNOSIS — M545 Low back pain: Secondary | ICD-10-CM

## 2013-05-12 LAB — POCT URINALYSIS DIPSTICK
Bilirubin, UA: NEGATIVE
Blood, UA: NEGATIVE
Glucose, UA: NEGATIVE
Ketones, UA: NEGATIVE
Leukocytes, UA: NEGATIVE
Nitrite, UA: NEGATIVE
Protein, UA: NEGATIVE
Spec Grav, UA: <=1.005
Urobilinogen, UA: NEGATIVE
pH, UA: 5.5

## 2013-05-12 NOTE — Telephone Encounter (Addendum)
Pt walk in. Pt decline appt tomorrow. Pt stated he will find a new MD

## 2013-05-12 NOTE — Telephone Encounter (Signed)
See if he can wait until tomorrow and make him an appt for tomorrow

## 2013-05-12 NOTE — Telephone Encounter (Signed)
Patient Information:  Caller Name: Hal  Phone: [phone]  Patient: Terry Byrd, Terry Byrd  Gender: Male  DOB: 1956/02/11  Age: 57 Years  PCP: Gershon Crane [facility])  Office Follow Up:  Does the office need to follow up with this patient?: Yes  Instructions For The Office: PLEASE CONTACT FOR APPT TIME. (NOTED 15;00 AVAILABLE)  TRIAGE TO BE SEE NOW.  RN Note:  Patient dispostion to be seen now in office. No appt available at this time. Noted an appt at 15:00.  Will forward message to the office to follow up with patient for appt time.   PLEASE CONTACT  Symptoms  Reason For Call & Symptoms: He states he was in the office two weeks for abdominal pain and cramping. DX with Diverticulitis/Constipation. He reports he will finish antibiotics today but  still have a fullness feeling, indigestion, weight loss 11 lbs. He noticed some blood in urine yesterday 05/11/13, flank pain for 4-5 days. Afebrile . No frequency , no burning.. Denies urinary pain .  Reviewed Health History In EMR: Yes  Reviewed Medications In EMR: Yes  Reviewed Allergies In EMR: Yes  Reviewed Surgeries / Procedures: Yes  Date of Onset of Symptoms: 05/11/2013  Treatments Tried: cipro for diverticulitis  Treatments Tried Worked: Yes  Guideline(s) Used:  Urination Pain - Male  Disposition Per Guideline:   Go to Office Now  Reason For Disposition Reached:   Side (flank) or lower back pain present  Advice Given:  Fluids  : Drink extra fluids (Reason: to produce a dilute, nonirritating urine).  Call Back If:  You become worse.  RN Overrode Recommendation:  Make Appointment  PATIENT TRIAGES TO BE SEEN NOW IN OFFICE. PLEASE CONTACT FOR APPT.

## 2013-05-13 ENCOUNTER — Other Ambulatory Visit: Payer: Self-pay | Admitting: Family

## 2013-05-13 ENCOUNTER — Telehealth: Payer: Self-pay | Admitting: *Deleted

## 2013-05-13 ENCOUNTER — Telehealth: Payer: Self-pay | Admitting: Family Medicine

## 2013-05-13 ENCOUNTER — Telehealth: Payer: Self-pay | Admitting: Internal Medicine

## 2013-05-13 ENCOUNTER — Ambulatory Visit: Payer: Managed Care, Other (non HMO) | Admitting: Physician Assistant

## 2013-05-13 ENCOUNTER — Encounter: Payer: Self-pay | Admitting: *Deleted

## 2013-05-13 DIAGNOSIS — R109 Unspecified abdominal pain: Secondary | ICD-10-CM

## 2013-05-13 DIAGNOSIS — M549 Dorsalgia, unspecified: Secondary | ICD-10-CM

## 2013-05-13 DIAGNOSIS — R319 Hematuria, unspecified: Secondary | ICD-10-CM

## 2013-05-13 LAB — URINE CULTURE
Colony Count: NO GROWTH
Organism ID, Bacteria: NO GROWTH

## 2013-05-13 NOTE — Telephone Encounter (Signed)
Spoke with patient's wife and she states he is having bloating, belching and lower abdominal pain. He saw his PCP yesterday for lower back pain and blood in urine. He was told it might be due to antibiotics he had been taking. Per patient's wife, he would like to be seen by extender as recommended by his friend Dr. Marina Goodell. Scheduled with Mike Gip, PA at 3:30 PM today. Patient's wife states he had an xray with PCP yesterday. Encouraged her to call PCP for results.

## 2013-05-13 NOTE — Progress Notes (Signed)
Subjective:    Patient ID: Terry Byrd, male    DOB: 11-03-1955, 57 y.o.   MRN: [id number]  HPI 57 year old white male, nonsmoker is in today with complaints of seeing blood in his urine this morning. He denies any burning with urination, frequency or urgency. He reports having low back pain is worse with movement. Describes it as sore, 4/10. He is currently taking Cipro for possible diverticulitis.   Review of Systems  Constitutional: Negative.   Respiratory: Negative.   Cardiovascular: Negative.   Gastrointestinal: Negative.   Genitourinary: Positive for hematuria. Negative for dysuria, urgency and frequency.  Skin: Negative.   Neurological: Negative.   Psychiatric/Behavioral: Negative.    Past Medical History  Diagnosis Date  . Substance abuse     alcohol abuse, quit in 1998  . Neck pain, chronic   . Diverticulosis 08/11/2008    History   Social History  . Marital Status: Married    Spouse Name: N/A    Number of Children: N/A  . Years of Education: N/A   Occupational History  . Not on file.   Social History Main Topics  . Smoking status: Former Smoker    Types: Cigarettes  . Smokeless tobacco: Former Neurosurgeon    Quit date: 08/01/2011  . Alcohol Use: No  . Drug Use: No  . Sexual Activity: Not on file   Other Topics Concern  . Not on file   Social History Narrative  . No narrative on file    Past Surgical History  Procedure Laterality Date  . Hernia repair      right inguinal   . Vasectomy    . Colonoscopy  08-11-08    per Dr. Lina Sar, normal , repeat in 5 yrs     Family History  Problem Relation Age of Onset  . Diabetes    . Cancer      colon    Allergies  Allergen Reactions  . Codeine     REACTION: nausea/vomiting  . Penicillins     Current Outpatient Prescriptions on File Prior to Visit  Medication Sig Dispense Refill  . ciprofloxacin (CIPRO) 500 MG tablet Take 1 tablet (500 mg total) by mouth 2 (two) times daily.  20 tablet  0  .  cyclobenzaprine (FLEXERIL) 10 MG tablet take 1 tablet by mouth three times a day if needed for SPASMS  60 tablet  11  . HYDROcodone-acetaminophen (NORCO) 10-325 MG per tablet TAKE 1 TABLET BY MOUTH EVERY 6 HOURS AS NEEDED FOR PAIN  60 tablet  2  . Multiple Vitamins-Minerals (MULTIVITAMIN,TX-MINERALS) tablet Take 1 tablet by mouth daily.        . sildenafil (VIAGRA) 100 MG tablet Take 1 tablet (100 mg total) by mouth as needed for erectile dysfunction.  10 tablet  11  . Tamsulosin HCl (FLOMAX) 0.4 MG CAPS Take 1 capsule (0.4 mg total) by mouth daily.  30 capsule  11   No current facility-administered medications on file prior to visit.    BP 104/80  Temp(Src) 97.9 F (36.6 C) (Oral)  Wt 215 lb (97.523 kg)  BMI 31.31 kg/m2chart    Objective:   Physical Exam  Constitutional: He is oriented to person, place, and time. He appears well-developed and well-nourished.  Neck: Normal range of motion. Neck supple.  Cardiovascular: Normal rate, regular rhythm and normal heart sounds.   Pulmonary/Chest: Effort normal and breath sounds normal.  Abdominal: Soft. Bowel sounds are normal.  Musculoskeletal: Normal range of motion.  Neurological:  He is alert and oriented to person, place, and time.  Skin: Skin is warm and dry.  Psychiatric: He has a normal mood and affect.          Assessment & Plan:  Assessment: 1. Hematuria-rule out 2. Diverticulitis 3. Low back pain/back  Plan: Continue Cipro. I believe that the discoloration from his urine is coming taken Cipro. He has no blood in his urine today. Urine culture sent. Patient called the office with any questions or concerns.

## 2013-05-13 NOTE — Telephone Encounter (Signed)
Spoke to patient. CT ordered

## 2013-05-13 NOTE — Telephone Encounter (Signed)
Flags: Call patient      Terry Byrd     MRN: [id number] DOB: [birth year]     Pt Work: [phone] Pt Home: [phone]              Message    Rene Kocher- I reviewed xray and he has a right distal ureteral stone - he does not need a Gi appt today- please call him and have them follow up with Dr.Frye      Cancelled appointment. Patient's wife aware.

## 2013-05-13 NOTE — Telephone Encounter (Signed)
He was seen by Adline Mango

## 2013-05-13 NOTE — Telephone Encounter (Signed)
Pt request results of xray done yesterday. pls call . Pt has 3;30 dr  app w/ . would like to know prior to appt.

## 2013-05-14 ENCOUNTER — Ambulatory Visit (INDEPENDENT_AMBULATORY_CARE_PROVIDER_SITE_OTHER)
Admission: RE | Admit: 2013-05-14 | Discharge: 2013-05-14 | Disposition: A | Discharge status: Home / Self Care | Payer: Managed Care, Other (non HMO) | Source: Ambulatory Visit | Attending: Family | Admitting: Family

## 2013-05-14 DIAGNOSIS — R109 Unspecified abdominal pain: Secondary | ICD-10-CM

## 2013-05-14 DIAGNOSIS — R319 Hematuria, unspecified: Secondary | ICD-10-CM

## 2013-05-14 DIAGNOSIS — M549 Dorsalgia, unspecified: Secondary | ICD-10-CM

## 2013-05-15 ENCOUNTER — Telehealth: Payer: Self-pay | Admitting: Family Medicine

## 2013-05-15 DIAGNOSIS — R918 Other nonspecific abnormal finding of lung field: Secondary | ICD-10-CM

## 2013-05-15 NOTE — Telephone Encounter (Signed)
Attempt to phone patient at 1221pm.  Spoke to Dr. Clent Ridges who suggest we proceed with a CT scan of the chest with contrast to evaluate incidental lung nodules. It appears he has passed a stone. None found on CT. Diverticulosis is present but no active inflammation or infection. If continues to have abdominal pain, we need to refer to GI.

## 2013-05-15 NOTE — Telephone Encounter (Addendum)
Pt wife is calling requesting ct scan results. Pt is planning on going out of town tomorrow

## 2013-05-15 NOTE — Telephone Encounter (Signed)
Pt aware of Padonda's note. He doesn't understand why he should have a CT of the chest and is declining at this time. States that if [place] and Dr. Clent Ridges feel like it is essential, then he will have it done but needs to know why. Pt says Padonda can call him back to explain

## 2013-05-15 NOTE — Telephone Encounter (Signed)
Pt saw padonda 10/6 and would like results of ct scan done yesterday.

## 2013-05-16 ENCOUNTER — Other Ambulatory Visit: Payer: Self-pay | Admitting: Family

## 2013-05-16 DIAGNOSIS — R911 Solitary pulmonary nodule: Secondary | ICD-10-CM

## 2013-05-19 ENCOUNTER — Telehealth: Payer: Self-pay | Admitting: Internal Medicine

## 2013-05-19 ENCOUNTER — Ambulatory Visit (INDEPENDENT_AMBULATORY_CARE_PROVIDER_SITE_OTHER): Payer: Managed Care, Other (non HMO) | Admitting: Gastroenterology

## 2013-05-19 ENCOUNTER — Other Ambulatory Visit (INDEPENDENT_AMBULATORY_CARE_PROVIDER_SITE_OTHER): Payer: Managed Care, Other (non HMO)

## 2013-05-19 ENCOUNTER — Encounter: Payer: Self-pay | Admitting: Gastroenterology

## 2013-05-19 ENCOUNTER — Telehealth: Payer: Self-pay | Admitting: *Deleted

## 2013-05-19 VITALS — BP 120/88 | HR 76 | Ht 68.5 in | Wt 212.4 lb

## 2013-05-19 DIAGNOSIS — R109 Unspecified abdominal pain: Secondary | ICD-10-CM

## 2013-05-19 DIAGNOSIS — R634 Abnormal weight loss: Secondary | ICD-10-CM

## 2013-05-19 LAB — COMPREHENSIVE METABOLIC PANEL
ALT: 625 U/L — ABNORMAL HIGH (ref 0–53)
AST: 268 U/L — ABNORMAL HIGH (ref 0–37)
Alkaline Phosphatase: 298 U/L — ABNORMAL HIGH (ref 39–117)
CO2: 30 mEq/L (ref 19–32)
Calcium: 9.2 mg/dL (ref 8.4–10.5)
Chloride: 101 mEq/L (ref 96–112)
Creatinine, Ser: 0.9 mg/dL (ref 0.4–1.5)
GFR: 90.05 mL/min (ref >60.00)
Potassium: 3.9 mEq/L (ref 3.5–5.1)
Sodium: 139 mEq/L (ref 135–145)
Total Bilirubin: 9.8 mg/dL — ABNORMAL HIGH (ref 0.3–1.2)
Total Protein: 6.9 g/dL (ref 6.0–8.3)

## 2013-05-19 LAB — CBC WITH DIFFERENTIAL/PLATELET
Basophils Absolute: 0.1 10*3/uL (ref 0.0–0.1)
Eosinophils Absolute: 0.1 10*3/uL (ref 0.0–0.7)
HCT: 42.3 % (ref 39.0–52.0)
Hemoglobin: 14.4 g/dL (ref 13.0–17.0)
Lymphs Abs: 1.6 10*3/uL (ref 0.7–4.0)
MCHC: 34 g/dL (ref 30.0–36.0)
Monocytes Absolute: 0.7 10*3/uL (ref 0.1–1.0)
Neutro Abs: 3.9 10*3/uL (ref 1.4–7.7)
Platelets: 259 10*3/uL (ref 150.0–400.0)
RDW: 15.9 % — ABNORMAL HIGH (ref 11.5–14.6)

## 2013-05-19 MED ORDER — DICYCLOMINE HCL 10 MG PO CAPS: ORAL_CAPSULE | ORAL | Status: DC

## 2013-05-19 NOTE — Telephone Encounter (Signed)
Wife reports pt has had problems for 3 weeks and has lost 13 lbs.  He saw Dr Clent Ridges on 05/02/13 who dx him with Diverticulitis and ordered Cipro. He got no better and a CT was ordered showing severe diverticulosis, but no diverticulitis. He had an appt scheduled with Mike Gip, PA, but felt a little better, so it was cancelled. Pt is no better, and would like to be seen. He is gassy, bloated and having frequent stools; no fever and no blood. Pt will be seen today by Doug Sou, PA.  Dr Juanda Chance, pt would like to switch to Dr Marina Goodell, OK with you? Thanks.

## 2013-05-19 NOTE — Telephone Encounter (Signed)
OK  DB

## 2013-05-19 NOTE — Telephone Encounter (Signed)
Pt is requesting to switch care from Dr. Juanda Chance to Dr. Marina Goodell. Per the spouse they want a long term doctor at this point.

## 2013-05-19 NOTE — Progress Notes (Signed)
05/19/2013 Terry Byrd [id number] 1955-09-22   HISTORY OF PRESENT ILLNESS:  This is a 57 year old male who is a patient of Dr. Regino Schultze. He is known to her only for colonoscopy which he had performed in January of 2010, which showed only moderate diverticulosis in the sigmoid colon at that time.  He presents to our office today for symptoms that began about 3-4 weeks ago.  He complains of somewhat general, nonspecific abdominal pain but somewhat more centered to the mid-abdomen. He complains of loss of appetite, weight loss of approximately 13 pounds, and bloating. He denies any nausea, vomiting, or change in bowels including dark or bloody stools. He saw his PCP who did not order any labs, but treated him empirically for an episode of diverticulitis with Cipro. Symptoms did not resolve so CT scan of the abdomen and pelvis without contrast was ordered. There was no evidence of diverticulitis. It did show some hepatic steatosis and some findings in the lung bases, which are being evaluated a CT scan of the chest with contrast on Wednesday of this week. Gallbladder and pancreas appeared normal. He said that he has tried Alka-Seltzer, TUMS, and Gas-X without much relief.    Past Medical History  Diagnosis Date  . Substance abuse     alcohol abuse, quit in 1998  . Neck pain, chronic   . Diverticulosis 08/11/2008   Past Surgical History  Procedure Laterality Date  . Inguinal hernia repair Right     age 82  . Vasectomy    . Colonoscopy  08-11-08    per Dr. Lina Sar, normal , repeat in 5 yrs   . Tonsillectomy and adenoidectomy      reports that he quit smoking about 21 months ago. His smoking use included Cigarettes. He smoked 0.00 packs per day. He has never used smokeless tobacco. He reports that he does not drink alcohol or use illicit drugs. family history includes Colon cancer in his mother; Diabetes in his father. Allergies  Allergen Reactions  . Codeine     REACTION:  nausea/vomiting  . Penicillins       Outpatient Encounter Prescriptions as of 05/19/2013  Medication Sig Dispense Refill  . acetaminophen (TYLENOL) 500 MG tablet Take 500 mg by mouth every 6 (six) hours as needed for pain.      . cyclobenzaprine (FLEXERIL) 10 MG tablet take 1 tablet by mouth three times a day if needed for SPASMS  60 tablet  11  . Multiple Vitamins-Minerals (MULTIVITAMIN,TX-MINERALS) tablet Take 1 tablet by mouth daily.        . sildenafil (VIAGRA) 100 MG tablet Take 1 tablet (100 mg total) by mouth as needed for erectile dysfunction.  10 tablet  11  . dicyclomine (BENTYL) 10 MG capsule Take 1 tab every 8 hours as needed for abdominal pIn  30 capsule  0  . [DISCONTINUED] ciprofloxacin (CIPRO) 500 MG tablet Take 1 tablet (500 mg total) by mouth 2 (two) times daily.  20 tablet  0  . [DISCONTINUED] HYDROcodone-acetaminophen (NORCO) 10-325 MG per tablet TAKE 1 TABLET BY MOUTH EVERY 6 HOURS AS NEEDED FOR PAIN  60 tablet  2  . [DISCONTINUED] Tamsulosin HCl (FLOMAX) 0.4 MG CAPS Take 1 capsule (0.4 mg total) by mouth daily.  30 capsule  11   No facility-administered encounter medications on file as of 05/19/2013.     REVIEW OF SYSTEMS  : All other systems reviewed and negative except where noted in the History of Present Illness.  PHYSICAL EXAM: BP 120/88  Pulse 76  Ht 5' 8.5" (1.74 m)  Wt 96.333 kg (212 lb 6 oz)  BMI 31.82 kg/m2 General: Well developed white male in no acute distress Head: Normocephalic and atraumatic Eyes:  Sclerae appear slightly icteric. Ears: Normal auditory acuity Lungs: Clear throughout to auscultation Heart: Regular rate and rhythm Abdomen: Soft, non-distended.  BS present.   Minimal TTP> in the mid-abdomen, just superior to the umbilicus.  Musculoskeletal: Symmetrical with no gross deformities  Skin: No lesions on visible extremities.  He appears jaundiced to me. Extremities: No edema  Neurological: Alert oriented x 4, grossly  nonfocal Psychological:  Alert and cooperative. Normal mood and affect  ASSESSMENT AND PLAN: -57 year old male with complaints of abdominal pain/discomfort, weight loss of approximately 13 pounds, loss of appetite, and bloating that began about 3 or 4 weeks ago. CT scan of the abdomen and pelvis without contrast was unrevealing. He does appear jaundiced to me, however, he states he has not noticed any change in his skin color. We will start by ordering some basic labs including CBC, CMP, and lipase. He is scheduled for a CT scan of the chest with contrast on Wednesday for evaluation of some incidental findings on the CT of the abdomen and pelvis that he had last week. We will see if we can add CT of the abdomen and pelvis with contrast to that study. I am unsure of the cause of his symptoms at this point. Will give him some Bentyl 10 mg to take 3 times a day for now to see if that helps with his symptoms in the interim.

## 2013-05-19 NOTE — Telephone Encounter (Signed)
[facility] and verbally gave the order for the prescription for the Bentyl 10 mg.  I tried to do it electronically and it did not go through.

## 2013-05-19 NOTE — Telephone Encounter (Signed)
Dr Marina Goodell, will you accept this pt; they state they has spoken to you? Thanks

## 2013-05-19 NOTE — Telephone Encounter (Signed)
lmom for wife to call back. 

## 2013-05-19 NOTE — Patient Instructions (Signed)
Please go to the basement level to have your labs drawn.  We sent a prescription for the Bentyl 10 mg.   We have added the Abdomen and Pelvis to the chest CT that is already scheduled for 05-21-2013.  You have been scheduled for a CT scan of the abdomen and pelvis at [facility] ([address]---this is in the same building as Architectural technologist).   You are scheduled on 05-21-2013 at 2:00 PM . You should arrive 15 minutes prior to your appointment time for registration. Please follow the written instructions below on the day of your exam:  WARNING: IF YOU ARE ALLERGIC TO IODINE/X-RAY DYE, PLEASE NOTIFY RADIOLOGY IMMEDIATELY AT [phone]! YOU WILL BE GIVEN A 13 HOUR PREMEDICATION PREP.  1) Do not eat or drink anything after [phone] (4 hours prior to your test) 2) You have been given 2 bottles of oral contrast to drink. The solution may taste better if refrigerated, but do NOT add ice or any other liquid to this solution. Shake well before drinking.    Drink 1 bottle of contrast @ Noon (2 hours prior to your exam)  Drink 1 bottle of contrast @ 1:00 PM  (1 hour prior to your exam)  You may take any medications as prescribed with a small amount of water except for the following: Metformin, Glucophage, Glucovance, Avandamet, Riomet, Fortamet, Actoplus Met, Janumet, Glumetza or Metaglip. The above medications must be held the day of the exam AND 48 hours after the exam.  The purpose of you drinking the oral contrast is to aid in the visualization of your intestinal tract. The contrast solution may cause some diarrhea. Before your exam is started, you will be given a small amount of fluid to drink. Depending on your individual set of symptoms, you may also receive an intravenous injection of x-ray contrast/dye. Plan on being at [facility] for 30 minutes or long, depending on the type of exam you are having performed.  If you have any questions regarding your exam or if you  need to reschedule, you may call the CT department at [phone] between the hours of 8:00 am and 5:00 pm, Monday-Friday.  ________________________________________________________________________

## 2013-05-20 ENCOUNTER — Telehealth: Payer: Self-pay | Admitting: Gastroenterology

## 2013-05-20 ENCOUNTER — Other Ambulatory Visit: Payer: Self-pay | Admitting: *Deleted

## 2013-05-20 NOTE — Telephone Encounter (Signed)
Patient is asking for lab results. He also wants to know if the blood work would show problems with pancreas.

## 2013-05-20 NOTE — Telephone Encounter (Signed)
Sure

## 2013-05-20 NOTE — Telephone Encounter (Signed)
See results note. 

## 2013-05-20 NOTE — Telephone Encounter (Signed)
Left a message for patient to call me. 

## 2013-05-20 NOTE — Progress Notes (Signed)
Reviewed and agree.

## 2013-05-21 ENCOUNTER — Other Ambulatory Visit: Payer: Self-pay | Admitting: Family

## 2013-05-21 ENCOUNTER — Other Ambulatory Visit: Payer: Managed Care, Other (non HMO)

## 2013-05-21 ENCOUNTER — Ambulatory Visit (HOSPITAL_COMMUNITY)
Admission: RE | Admit: 2013-05-21 | Discharge: 2013-05-21 | Disposition: A | Discharge status: Home / Self Care | Payer: Managed Care, Other (non HMO) | Source: Ambulatory Visit | Attending: Gastroenterology | Admitting: Gastroenterology

## 2013-05-21 ENCOUNTER — Encounter (HOSPITAL_COMMUNITY)
Admission: AD | Disposition: A | Discharge status: Home / Self Care | Payer: Self-pay | Source: Ambulatory Visit | Attending: Internal Medicine

## 2013-05-21 ENCOUNTER — Telehealth: Payer: Self-pay | Admitting: Gastroenterology

## 2013-05-21 ENCOUNTER — Ambulatory Visit (HOSPITAL_COMMUNITY)
Admission: RE | Admit: 2013-05-21 | Discharge: 2013-05-21 | Disposition: A | Discharge status: Home / Self Care | Payer: Managed Care, Other (non HMO) | Source: Ambulatory Visit | Attending: Family | Admitting: Family

## 2013-05-21 ENCOUNTER — Observation Stay (HOSPITAL_COMMUNITY)
Admission: AD | Admit: 2013-05-21 | Discharge: 2013-05-22 | Disposition: A | Discharge status: Home / Self Care | Payer: Managed Care, Other (non HMO) | Source: Ambulatory Visit | Attending: Internal Medicine | Admitting: Internal Medicine

## 2013-05-21 ENCOUNTER — Ambulatory Visit: Payer: Managed Care, Other (non HMO)

## 2013-05-21 ENCOUNTER — Encounter (HOSPITAL_COMMUNITY): Payer: Self-pay

## 2013-05-21 ENCOUNTER — Observation Stay (HOSPITAL_COMMUNITY): Payer: Managed Care, Other (non HMO)

## 2013-05-21 DIAGNOSIS — K8689 Other specified diseases of pancreas: Secondary | ICD-10-CM

## 2013-05-21 DIAGNOSIS — R918 Other nonspecific abnormal finding of lung field: Secondary | ICD-10-CM | POA: Insufficient documentation

## 2013-05-21 DIAGNOSIS — K838 Other specified diseases of biliary tract: Secondary | ICD-10-CM

## 2013-05-21 DIAGNOSIS — K7689 Other specified diseases of liver: Secondary | ICD-10-CM | POA: Insufficient documentation

## 2013-05-21 DIAGNOSIS — R634 Abnormal weight loss: Secondary | ICD-10-CM | POA: Insufficient documentation

## 2013-05-21 DIAGNOSIS — R7989 Other specified abnormal findings of blood chemistry: Secondary | ICD-10-CM | POA: Insufficient documentation

## 2013-05-21 DIAGNOSIS — R17 Unspecified jaundice: Secondary | ICD-10-CM | POA: Insufficient documentation

## 2013-05-21 DIAGNOSIS — R109 Unspecified abdominal pain: Secondary | ICD-10-CM | POA: Insufficient documentation

## 2013-05-21 DIAGNOSIS — R932 Abnormal findings on diagnostic imaging of liver and biliary tract: Secondary | ICD-10-CM

## 2013-05-21 DIAGNOSIS — K8050 Calculus of bile duct without cholangitis or cholecystitis without obstruction: Secondary | ICD-10-CM

## 2013-05-21 DIAGNOSIS — K831 Obstruction of bile duct: Principal | ICD-10-CM | POA: Diagnosis present

## 2013-05-21 DIAGNOSIS — K828 Other specified diseases of gallbladder: Secondary | ICD-10-CM | POA: Insufficient documentation

## 2013-05-21 DIAGNOSIS — R599 Enlarged lymph nodes, unspecified: Secondary | ICD-10-CM | POA: Insufficient documentation

## 2013-05-21 HISTORY — PX: ERCP: SHX5425

## 2013-05-21 LAB — COMPREHENSIVE METABOLIC PANEL
ALT: 594 U/L — ABNORMAL HIGH (ref 0–53)
AST: 228 U/L — ABNORMAL HIGH (ref 0–37)
BUN: 17 mg/dL (ref 6–23)
Calcium: 8.9 mg/dL (ref 8.4–10.5)
Sodium: 134 mEq/L — ABNORMAL LOW (ref 135–145)
Total Bilirubin: 9 mg/dL — ABNORMAL HIGH (ref 0.3–1.2)
Total Protein: 6.6 g/dL (ref 6.0–8.3)

## 2013-05-21 SURGERY — ERCP, WITH INTERVENTION IF INDICATED
Anesthesia: Moderate Sedation

## 2013-05-21 MED ORDER — DIPHENHYDRAMINE HCL 50 MG/ML IJ SOLN
INTRAMUSCULAR | Status: DC | PRN
  Administered 2013-05-21: 25 mg via INTRAVENOUS

## 2013-05-21 MED ORDER — CIPROFLOXACIN IN D5W 400 MG/200ML IV SOLN
400.0000 mg | Freq: Two times a day (BID) | INTRAVENOUS | Status: DC
  Administered 2013-05-21: 400 mg via INTRAVENOUS
  Filled 2013-05-21 (×2): qty 200

## 2013-05-21 MED ORDER — SODIUM CHLORIDE 0.9 % IV SOLN
INTRAVENOUS | Status: DC
  Administered 2013-05-21 – 2013-05-22 (×2): via INTRAVENOUS

## 2013-05-21 MED ORDER — DEXTROSE-NACL 5-0.45 % IV SOLN
INTRAVENOUS | Status: DC
  Administered 2013-05-21: 11:00:00 via INTRAVENOUS

## 2013-05-21 MED ORDER — MIDAZOLAM HCL 10 MG/2ML IJ SOLN
INTRAMUSCULAR | Status: DC | PRN
  Administered 2013-05-21: 2 mg via INTRAVENOUS
  Administered 2013-05-21 (×3): 1 mg via INTRAVENOUS
  Administered 2013-05-21: 2 mg via INTRAVENOUS

## 2013-05-21 MED ORDER — GLUCAGON HCL (RDNA) 1 MG IJ SOLR
INTRAMUSCULAR | Status: AC
  Filled 2013-05-21: qty 2

## 2013-05-21 MED ORDER — FENTANYL CITRATE 0.05 MG/ML IJ SOLN
INTRAMUSCULAR | Status: AC
  Filled 2013-05-21: qty 4

## 2013-05-21 MED ORDER — CALCIUM CARBONATE ANTACID 500 MG PO CHEW
1.0000 | CHEWABLE_TABLET | Freq: Two times a day (BID) | ORAL | Status: DC | PRN
Start: 2013-05-21 — End: 2013-05-22
  Administered 2013-05-21: 200 mg via ORAL
  Filled 2013-05-21: qty 1

## 2013-05-21 MED ORDER — ONDANSETRON HCL 4 MG/2ML IJ SOLN
4.0000 mg | Freq: Four times a day (QID) | INTRAMUSCULAR | Status: DC | PRN
  Administered 2013-05-21 – 2013-05-22 (×2): 4 mg via INTRAVENOUS
  Filled 2013-05-21 (×3): qty 2

## 2013-05-21 MED ORDER — FENTANYL CITRATE 0.05 MG/ML IJ SOLN
INTRAMUSCULAR | Status: DC | PRN
  Administered 2013-05-21 (×3): 25 ug via INTRAVENOUS

## 2013-05-21 MED ORDER — CYCLOBENZAPRINE HCL 5 MG PO TABS
5.0000 mg | ORAL_TABLET | Freq: Three times a day (TID) | ORAL | Status: DC | PRN
  Filled 2013-05-21: qty 1

## 2013-05-21 MED ORDER — BUTAMBEN-TETRACAINE-BENZOCAINE 2-2-14 % EX AERO
INHALATION_SPRAY | CUTANEOUS | Status: DC | PRN
  Administered 2013-05-21: 2 via TOPICAL

## 2013-05-21 MED ORDER — GLUCAGON HCL (RDNA) 1 MG IJ SOLR
INTRAMUSCULAR | Status: DC | PRN
  Administered 2013-05-21 (×3): .5 mg via INTRAVENOUS

## 2013-05-21 MED ORDER — IOHEXOL 300 MG/ML  SOLN
80.0000 mL | Freq: Once | INTRAMUSCULAR | Status: AC | PRN
  Administered 2013-05-21: 80 mL via INTRAVENOUS

## 2013-05-21 MED ORDER — DIPHENHYDRAMINE HCL 50 MG/ML IJ SOLN
INTRAMUSCULAR | Status: AC
  Filled 2013-05-21: qty 1

## 2013-05-21 MED ORDER — ONDANSETRON HCL 4 MG PO TABS: 4.0000 mg | ORAL_TABLET | Freq: Four times a day (QID) | ORAL | Status: DC | PRN

## 2013-05-21 MED ORDER — INDOMETHACIN 50 MG RE SUPP
100.0000 mg | Freq: Once | RECTAL | Status: AC
  Administered 2013-05-21: 100 mg via RECTAL
  Filled 2013-05-21 (×2): qty 2

## 2013-05-21 MED ORDER — SODIUM CHLORIDE 0.9 % IV SOLN
INTRAVENOUS | Status: DC | PRN
  Administered 2013-05-21: 17:00:00

## 2013-05-21 MED ORDER — MIDAZOLAM HCL 10 MG/2ML IJ SOLN
INTRAMUSCULAR | Status: AC
  Filled 2013-05-21: qty 4

## 2013-05-21 MED ORDER — HYDROMORPHONE HCL PF 1 MG/ML IJ SOLN
1.0000 mg | INTRAMUSCULAR | Status: DC | PRN
  Administered 2013-05-21 – 2013-05-22 (×7): 1 mg via INTRAVENOUS
  Filled 2013-05-21 (×3): qty 1
  Filled 2013-05-21: qty 2
  Filled 2013-05-21 (×3): qty 1

## 2013-05-21 NOTE — H&P (Signed)
Admit Note  Primary Care Physician:  [name], MD Primary Gastroenterologist:    Lina Sar MD  HPI: Terry Byrd is a 57 y.o. male seen in our office two days ago for mid abdominal pain, weight loss. Patient had seen PCP who treated him empirically for diverticulitis. Patient failed to improve, non-contrast CTscan done and revealed diverticulosis without diverticulitis. Other findings included steatosis and pulmonary nodules. Patient was referred to [place].  LFTs found to be markedly abnormal, mixed pattern. We ordered an ultrasound which revealed gallbladder sludge, dilatation of the common bile duct and mild intrahepatic biliary ductal dilatation. Follow up chest CTscan with contrast again demonstrates multiple pulmonary nodules.   Patient admitted for pain control and further evaluation. Patient gives a 3-4 week history of constant mid to upper abdominal pain. He also hurts in his back.  No significant nausea. He has lost about 13 pounds as eating does exacerbate the pain. No chills or fevers.    Past Medical History  Diagnosis Date  . Substance abuse     alcohol abuse, quit in 1998  . Neck pain, chronic   . Diverticulosis 08/11/2008    Past Surgical History  Procedure Laterality Date  . Inguinal hernia repair Right     age 56  . Vasectomy    . Colonoscopy  08-11-08    per Dr. Lina Sar, normal , repeat in 5 yrs   . Tonsillectomy and adenoidectomy      Prior to Admission medications   Medication Sig Start Date End Date Taking? Authorizing Provider  acetaminophen (TYLENOL) 500 MG tablet Take 500 mg by mouth every 6 (six) hours as needed for pain.    Historical Provider, MD  cyclobenzaprine (FLEXERIL) 10 MG tablet take 1 tablet by mouth three times a day if needed for SPASMS 04/17/12   [name], MD  dicyclomine (BENTYL) 10 MG capsule Take 1 tab every 8 hours as needed for abdominal pIn 05/19/13   Jessica D. Zehr, PA-C  Multiple Vitamins-Minerals  (MULTIVITAMIN,TX-MINERALS) tablet Take 1 tablet by mouth daily.      Historical Provider, MD  sildenafil (VIAGRA) 100 MG tablet Take 1 tablet (100 mg total) by mouth as needed for erectile dysfunction. 09/10/12   [name], MD    Current Facility-Administered Medications  Medication Dose Route Frequency Provider Last Rate Last Dose  . 0.9 %  sodium chloride infusion   Intravenous Continuous Meredith Pel, NP      . cyclobenzaprine (FLEXERIL) tablet 5 mg  5 mg Oral TID PRN Meredith Pel, NP      . dextrose 5 %-0.45 % sodium chloride infusion   Intravenous Continuous Hart Carwin, MD      . HYDROmorphone (DILAUDID) injection 1-2 mg  1-2 mg Intravenous Q2H PRN Hart Carwin, MD      . ondansetron [facility]) tablet 4 mg  4 mg Oral Q6H PRN Meredith Pel, NP       Or  . ondansetron [facility]) injection 4 mg  4 mg Intravenous Q6H PRN Meredith Pel, NP        Allergies as of 05/21/2013 - Review Complete 05/21/2013  Allergen Reaction Noted  . Codeine  03/26/2007  . Penicillins  03/26/2007    Family History  Problem Relation Age of Onset  . Diabetes Father   . Colon cancer Mother     History   Social History  . Marital Status: Married    Spouse Name: N/A  Number of Children: 2  . Years of Education: N/A   Occupational History  . sales rep    Social History Main Topics  . Smoking status: Former Smoker    Types: Cigarettes    Quit date: 08/08/2011  . Smokeless tobacco: Never Used  . Alcohol Use: No     Comment: quit 1998  . Drug Use: No  . Sexual Activity: Not on file   Other Topics Concern  . Not on file   Social History Narrative  . No narrative on file    Review of Systems: All systems reviewed an negative except where noted in HPI.  Physical Exam: Vital signs in last 24 hours: Temp:  [97.7 F (36.5 C)] 97.7 F (36.5 C) (10/15 1059) Pulse Rate:  [77] 77 (10/15 1059) Resp:  [18] 18 (10/15 1059) BP: (142)/(93) 142/93 mmHg (10/15 1059) SpO2:  [96 %]  96 % (10/15 1059)   General:  Pleasant, well-developed, white male in NAD Head:  Normocephalic and atraumatic. Eyes:  Icteric sclera. Ears:  Normal auditory acuity.  Neck:  Supple; no masses . Lungs:  Clear throughout to auscultation.   No wheezes, crackles, or rhonchi. No acute distress. Heart:  Regular rate and rhythm Abdomen:  Soft, nondistended, nontender (just got dilaudid). No masses, hepatomegaly.  Normal bowel .    Msk:  Symmetrical without gross deformities.. Pulses:  Normal pulses noted. Extremities:  Without edema. Neurologic:  Alert and  oriented x4;  grossly normal neurologically. Skin:  Intact without significant lesions or rashes. Cervical Nodes:  No significant cervical adenopathy. Psych:  Alert and cooperative. Normal mood and affect.  Lab Results:  Recent Labs  05/19/13 1525  WBC 6.3  HGB 14.4  HCT 42.3  PLT 259.0   BMET  Recent Labs  05/19/13 1525  NA 139  K 3.9  CL 101  CO2 30  GLUCOSE 94  BUN 19  CREATININE 0.9  CALCIUM 9.2   LFT  Recent Labs  05/19/13 1525  PROT 6.9  ALBUMIN 3.7  AST 268*  ALT 625*  ALKPHOS 298*  BILITOT 9.8*    Studies/Results: Ct Chest W Contrast  05/21/2013   CLINICAL DATA:  Lung nodules  EXAM: CT CHEST WITH CONTRAST  TECHNIQUE: Multidetector CT imaging of the chest was performed during intravenous contrast administration.  CONTRAST:  80mL OMNIPAQUE IOHEXOL 300 MG/ML  SOLN  COMPARISON:  [facility] abdomen and pelvis CT exam dated 05/14/2013.  FINDINGS: There is no axillary lymphadenopathy. Upper normal 7 mm short axis high right peritracheal lymph node is identified. No mediastinal or hilar lymphadenopathy. Heart size is normal. No pericardial or pleural effusion.  Lung windows show a 9 x 16 sub solid right lower lobe this may need pulmonary nodule on image 35. The 7 mm left lower lobe pulmonary nodule on image 35 is stable in the interval. 9 mm right lower lobe posteromedial nodule (image 40) is also stable   Subsegmental atelectasis is seen in the posterior right lower lobe.  Bone windows reveal no worrisome lytic or sclerotic osseous lesions. Images which include the upper abdomen show intra and extrahepatic biliary duct dilatation, as before, but this is incompletely visualized.  IMPRESSION: Multiple pulmonary nodules as described previously. The 3 nodules or seen in the lung bases on the previous study represent the only 3 nodule seen in the lungs on the CT of the chest. Initial followup CT at 3 months recommended to confirm persistence. Adenocarcinoma cannot be excluded. This recommendation follows the consensus  statement: Recommendations for the Management of Subsolid Pulmonary Nodules Detected at CT: A Statement from the Fleischner Society as published in Radiology 2013; 266:304-317.  Intra and extrahepatic biliary duct dilatation, incompletely visualized.   Electronically Signed   By: Kennith Center M.D.   On: 05/21/2013 09:39   US Abdomen Complete  05/21/2013   CLINICAL DATA:  Elevated liver function tests. Elevated bilirubin.  EXAM: ULTRASOUND ABDOMEN COMPLETE  COMPARISON:  CT scan dated 05/14/2013  FINDINGS: Gallbladder  There is marked to dilatation of the intrahepatic and extrahepatic bile ducts. The common bile duct is dilated into the distal pancreatic head. There is no appreciable mass or stone. The gallbladder is distended and contains sludge but no visible stones.  Common bile duct  Diameter: 16 mm in diameter.  Liver  Liver parenchyma is echogenic consistent with hepatic steatosis. Single 9 mm cyst in the anterior aspect of the left lobe.  IVC  No abnormality visualized.  Pancreas  Visualized portion unremarkable.  Spleen  Size and appearance within normal limits.  8.0 cm in length.  Right Kidney  Length: 11.8 cm. Echogenicity within normal limits. No mass or hydronephrosis visualized.  Left Kidney  Length: 12.5 cm. Echogenicity within normal limits. No mass or hydronephrosis visualized.  Abdominal  aorta  Normal. 2.4 cm maximum diameter.  IMPRESSION: Marked dilatation of the biliary tree. The common bile duct is dilated into the distal pancreatic head. No appreciable mass or stone. Sludge in the gallbladder. Findings are consistent with distal common bile duct obstruction.  Hepatic steatosis.   Electronically Signed   By: Geanie Cooley M.D.   On: 05/21/2013 08:28    Impression / Plan:  69.  57 year old male with 3-4 week history of mid to upper abdominal pain, significant weight loss in setting of markedly abnormal LFTs and biliary duct dilation to level of pancreatic head. No masses seen. Hopefully bile duct obstruction is from sludge / stones but the constant nature of the pain and weight loss are a little concerning for other etiologies. Patient will be admitted for evaluation and treatment. Plan is for ERCP today with Dr. Leone Payor. The risks and benefits of the procedure were discussed with the patient and he agrees to proceed. Will check stat protime and begin Unasyn. Further recommendations following ERCP.   2. Multiple pulmonary nodules. Patient for outpatient pulmonary referral     LOS: 0 days   Willette Cluster  05/21/2013, 11:03 AM Attending MD note:   I have reviewed the above note, examined the patient and agree with plan of treatment.Painful jaundice x several weeks, treated for diverticulitis. No biliary history, Imaging studies consistent with extrahepatic biliary obstruction, distal CBD dilated, no obvious mass in the pancreas. He had a normal screening colonoscopy 5 years ago. IExam today done after pt received IV Dilaudid- abdomen mildly distended, non tender. Dr Leone Payor has seen the pt and will do ERCP with poss sphincterotomy/ stent placement this afternoon. Orders written, Family understands. Differential diagnosis: choledocholithiasis, Ampullary Ca, Pancreatic ca, Pancreatitis.  [facility] Pager # [phone]

## 2013-05-21 NOTE — Op Note (Signed)
[facility] [address] [place], [address]   ERCP PROCEDURE REPORT  PATIENT: Terry Byrd, Terry Byrd.  MR# :[id number] BIRTHDATE: [birth year]  GENDER: Male ENDOSCOPIST: Iva Boop, MD, [facility] PROCEDURE DATE:  05/21/2013 PROCEDURE:   ERCP with stent placement ASA CLASS:   Class II INDICATIONS:jaundice. Dilated bile ducts MEDICATIONS: Fentanyl 75 mcg IV, Versed-Detailed 7.5 mg IV, and Cipro 400 mg IV TOPICAL ANESTHETIC: Cetacaine Spray  DESCRIPTION OF PROCEDURE:   After the risks benefits and alternatives of the procedure were thoroughly explained, informed consent was obtained.  The Pentax ERCP [id number]  endoscope was introduced through the mouth  and advanced to the second portion of the duodenum .  The esophagus was not seen well. The z-line was well seen at the GEJ.  The endoscope was pushed into the fundus which was normal. The antrum, gastric body, first and second part of the duodenum were notable for edema in duodenum (folds) with bile seen. It was very difficult to find the papilla due to this and distorted anatomy, the duodenoscope had to be pushed into the long position.  Once the papilla was identified it was mildly inflamed and small. Wire cannulation of pancreatic duct accomplished with inability to pass wire far and could not fill with contrast. I was then able to cannulate the bile duct and inject contrast revealing a 3 cm long CBD stricture and dilated upstream ducts. Gallbladder did not fill. I took cytology brushings of the stricture and then placed an 8.5 Fr 5 cm stent with copious drainage of bile. . The scope was then completely withdrawn from the patient and the procedure terminated.     COMPLICATIONS: .  There were no complications.  ENDOSCOPIC IMPRESSION: 1.   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   Obstructive jaundice 3.   Pancreatic duct obstruction - suspected by inability to  pas wire - seems like a double duct sign.  RECOMMENDATIONS: Await cytology Will need contrast-enhanced CT abd/pelvis w/ pancreatic protocol next most likely - ? EUS before or after that.  Will discuss.      eSigned:  Iva Boop, MD, [facility] 05/21/2013 5:04 PM   UY:QIHK Marina Goodell, MD Adline Mango, FNP

## 2013-05-21 NOTE — Telephone Encounter (Signed)
Patient states he is having abdominal and lower back pain. He states he spoke with Dr. Marina Goodell last night and was told to call today. Patient rates the pain at "11." Spoke with Doug Sou, PA. Patient to be admitted to [facility]. Patient instructed to go to [facility] admitting. Patient states he will be there in about one hour. Willette Cluster, NP aware that patient will be arriving in 1 hour.

## 2013-05-22 ENCOUNTER — Observation Stay (HOSPITAL_COMMUNITY): Payer: Managed Care, Other (non HMO) | Admitting: *Deleted

## 2013-05-22 ENCOUNTER — Other Ambulatory Visit: Payer: Self-pay

## 2013-05-22 ENCOUNTER — Encounter (HOSPITAL_COMMUNITY)
Admission: AD | Disposition: A | Discharge status: Home / Self Care | Payer: Self-pay | Source: Ambulatory Visit | Attending: Internal Medicine

## 2013-05-22 ENCOUNTER — Encounter (HOSPITAL_COMMUNITY): Payer: Self-pay | Admitting: Internal Medicine

## 2013-05-22 ENCOUNTER — Encounter (HOSPITAL_COMMUNITY): Payer: Managed Care, Other (non HMO) | Admitting: *Deleted

## 2013-05-22 DIAGNOSIS — K8689 Other specified diseases of pancreas: Secondary | ICD-10-CM

## 2013-05-22 DIAGNOSIS — R932 Abnormal findings on diagnostic imaging of liver and biliary tract: Secondary | ICD-10-CM

## 2013-05-22 HISTORY — PX: EUS: SHX5427

## 2013-05-22 SURGERY — ESOPHAGEAL ENDOSCOPIC ULTRASOUND (EUS) RADIAL
Anesthesia: Monitor Anesthesia Care

## 2013-05-22 MED ORDER — MIDAZOLAM HCL 5 MG/5ML IJ SOLN
INTRAMUSCULAR | Status: DC | PRN
  Administered 2013-05-22: 2 mg via INTRAVENOUS

## 2013-05-22 MED ORDER — PROPOFOL INFUSION 10 MG/ML OPTIME: INTRAVENOUS | Status: DC | PRN

## 2013-05-22 MED ORDER — HYDROCODONE-ACETAMINOPHEN 5-325 MG PO TABS: 2.0000 | ORAL_TABLET | Freq: Four times a day (QID) | ORAL | Status: DC | PRN

## 2013-05-22 MED ORDER — PHENYLEPHRINE HCL 10 MG/ML IJ SOLN
INTRAMUSCULAR | Status: DC | PRN
  Administered 2013-05-22 (×2): 40 ug via INTRAVENOUS
  Administered 2013-05-22 (×2): 60 ug via INTRAVENOUS
  Administered 2013-05-22 (×2): 40 ug via INTRAVENOUS

## 2013-05-22 MED ORDER — KETAMINE HCL 10 MG/ML IJ SOLN
INTRAMUSCULAR | Status: DC | PRN
  Administered 2013-05-22: 5 mg via INTRAVENOUS
  Administered 2013-05-22: 10 mg via INTRAVENOUS
  Administered 2013-05-22 (×2): 5 mg via INTRAVENOUS

## 2013-05-22 MED ORDER — BUTAMBEN-TETRACAINE-BENZOCAINE 2-2-14 % EX AERO
INHALATION_SPRAY | CUTANEOUS | Status: DC | PRN
  Administered 2013-05-22: 2 via TOPICAL

## 2013-05-22 MED ORDER — LORAZEPAM 0.5 MG PO TABS: 0.5000 mg | ORAL_TABLET | Freq: Three times a day (TID) | ORAL | Status: DC

## 2013-05-22 MED ORDER — ONDANSETRON HCL 4 MG PO TABS: 4.0000 mg | ORAL_TABLET | Freq: Four times a day (QID) | ORAL | Status: AC | PRN

## 2013-05-22 MED ORDER — LACTATED RINGERS IV SOLN
INTRAVENOUS | Status: DC | PRN
  Administered 2013-05-22 (×2): via INTRAVENOUS

## 2013-05-22 MED ORDER — PROPOFOL INFUSION 10 MG/ML OPTIME
INTRAVENOUS | Status: DC | PRN
  Administered 2013-05-22: 120 ug/kg/min via INTRAVENOUS

## 2013-05-22 MED ORDER — ONDANSETRON HCL 4 MG/2ML IJ SOLN
INTRAMUSCULAR | Status: DC | PRN
  Administered 2013-05-22: 4 mg via INTRAMUSCULAR

## 2013-05-22 MED ORDER — PROPOFOL 10 MG/ML IV BOLUS
INTRAVENOUS | Status: DC | PRN
  Administered 2013-05-22: 25 mg via INTRAVENOUS
  Administered 2013-05-22: 50 mg via INTRAVENOUS
  Administered 2013-05-22: 25 mg via INTRAVENOUS

## 2013-05-22 NOTE — Progress Notes (Signed)
INITIAL NUTRITION ASSESSMENT  DOCUMENTATION CODES Per approved criteria  -Obesity Unspecified   INTERVENTION: Diet advancement per MD discretion; recommend low fat diet RD to continue to monitor nutrition care plan and provide further interventions as needed  NUTRITION DIAGNOSIS: Inadequate oral intake related to abdominal pain as evidenced by 6% wt loss in less than one month.   Goal: Pt to meet >/= 90% of their estimated nutrition needs    Monitor:  Diet advancement/PO intake Weight Labs  Reason for Assessment: Malnutrition Screening Tool, score of 3  57 y.o. male  Admitting Dx: <principal problem not specified>  ASSESSMENT: 57 y.o. male seen in our office two days ago for mid abdominal pain, weight loss. Patient had seen PCP who treated him empirically for diverticulitis. Patient failed to improve, non-contrast CTscan done and revealed diverticulosis without diverticulitis. Other findings included steatosis and pulmonary nodules. Ordered an ultrasound which revealed gallbladder sludge, dilatation of the common bile duct and mild intrahepatic biliary ductal dilatation. Follow up chest CTscan with contrast again demonstrates multiple pulmonary nodules.    Attempted to visit pt twice but, pt out of room at both attempts. Per chart pt has lost 15 lbs in 3 weeks due to abdominal pain. Pt has been eating less as eating exacerbates abdominal pain. 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 Weight history shows 14 lbs wt loss in less than one month- 6% wt loss.  Suspect pt may meet criteria for acute malnutrition but, unable to determine at this time.   Height: Ht Readings from Last 1 Encounters:  05/21/13 5' 8.5" (1.74 m)    Weight: Wt Readings from Last 1 Encounters:  05/21/13 206 lb 11.2 oz (93.759 kg)    Ideal Body Weight: 157 lbs  % Ideal Body Weight:  131%  Wt Readings from Last 10 Encounters:  05/21/13 206 lb 11.2 oz (93.759 kg)  05/21/13 206 lb 11.2 oz (93.759 kg)  05/21/13 206 lb 11.2 oz (93.759 kg)  05/19/13 212 lb 6 oz (96.333 kg)  05/12/13 215 lb (97.523 kg)  05/02/13 220 lb (99.791 kg)  09/10/12 221 lb (100.245 kg)  12/06/11 218 lb (98.884 kg)  12/06/10 209 lb (94.802 kg)  06/22/09 203 lb (92.08 kg)    Usual Body Weight: 220 lbs  % Usual Body Weight: 94%  BMI:  Body mass index is 30.97 kg/(m^2).  Estimated Nutritional Needs: Kcal: 2000-2300 Protein: 80-90 grams Fluid: 2.6 L/day  Skin: jaundice; intact  Diet Order: NPO  EDUCATION NEEDS: -Education not appropriate at this time   Intake/Output Summary (Last 24 hours) at 05/22/13 1317 Last data filed at 05/22/13 0559  Gross per 24 hour  Intake 868.34 ml  Output      0 ml  Net 868.34 ml    Last BM: 10/15  Labs:   Recent Labs Lab 05/19/13 1525 05/21/13 1225  NA 139 134*  K 3.9 3.9  CL 101 102  CO2 30 25  BUN 19 17  CREATININE 0.9 0.95  CALCIUM 9.2 8.9  GLUCOSE 94 102*    CBG (last 3)  No results found for this basename: GLUCAP,  in the last 72 hours  Scheduled Meds:   Continuous Infusions: . sodium chloride 50 mL/hr at 05/22/13 0210  . dextrose 5 % and 0.45% NaCl 50 mL/hr at 05/21/13 1110    Past Medical History  Diagnosis Date  . Substance abuse  alcohol abuse, quit in 1998  . Neck pain, chronic   . Diverticulosis 08/11/2008    Past Surgical History  Procedure Laterality Date  . Inguinal hernia repair Right     age 19  . Vasectomy    . Colonoscopy  08-11-08    per Dr. Lina Sar, normal , repeat in 5 yrs   . Tonsillectomy and adenoidectomy    . Ercp N/A 05/21/2013    Procedure: ENDOSCOPIC RETROGRADE CHOLANGIOPANCREATOGRAPHY (ERCP);  Surgeon: Iva Boop, MD;  Location: [facility];  Service: Endoscopy;  Laterality: N/A;    Ian Malkin RD, LDN Inpatient Clinical Dietitian Pager: [phone] After Hours Pager:  [phone]

## 2013-05-22 NOTE — Discharge Summary (Signed)
[facility] Discharge Summary  Name: Terry Byrd MRN: [id number] DOB: 02/08/1956 57 y.o. PCP:  [name], MD  Date of Admission: 05/21/2013 10:36 AM Date of Discharge: 05/22/2013 Primary Gastroenterologist: Yancey Flemings, MD Discharging Physician: Lina Sar, MD  Discharge Diagnosis: 1. Biliary obstruction 2. Chronic pancreaitis 3. Pulmonary nodules  Consultations: none   Procedures Performed:  Ct Abdomen Pelvis Wo Contrast  05/14/2013   CLINICAL DATA:  Lower abdominal pain for the past 2 weeks. Decreased appetite.  EXAM: CT ABDOMEN AND PELVIS WITHOUT CONTRAST  TECHNIQUE: Multidetector CT imaging of the abdomen and pelvis was performed following the standard protocol without intravenous contrast.  COMPARISON:  No priors.  FINDINGS: Lung Bases: 6 mm ground-glass attenuation nodule in the left lower lobe posterior laterally (image 3 of series 3). 1.9 x 1.1 cm ground-glass in relation nodule in the right lower lobe (image 5 of series 3). 9 mm ground-glass attenuation nodule in the medial aspect of the right lower lobe (image 11 of series 3).  Abdomen/Pelvis: Heterogeneous low attenuation throughout the hepatic parenchyma, compatible with heterogeneous hepatic steatosis. 9 mm low-attenuation lesion in segment 4B of the liver is incompletely characterized on today's CT examination, but favored to represent a tiny cyst. The appearance of the gallbladder, pancreas, spleen, bilateral kidneys and bilateral adrenal glands is unremarkable.  No significant volume of ascites. No pneumoperitoneum. No pathologic distention of small bowel. No definite lymphadenopathy identified within the abdomen or pelvis on today's non contrast CT examination. Normal appendix. Extensive colonic diverticulosis, particularly of the sigmoid colon, without surrounding inflammatory changes to suggest an acute diverticulitis at this time. Prostate gland and urinary bladder are unremarkable in appearance.   Musculoskeletal: There are no aggressive appearing lytic or blastic lesions noted in the visualized portions of the skeleton.  IMPRESSION: 1. No acute findings in the abdomen or pelvis to account for the patient's symptoms. 2. There is severe diverticulosis, particularly of the sigmoid colon, but no definite surrounding inflammatory changes to suggest an acute diverticulitis at this time. 3. Multiple ground-glass attenuation nodules throughout the lung bases bilaterally, largest of which is 1.9 x 1.1 cm in the right lower lobe. Initial follow-up by chest CT without contrast is recommended in 3 months to confirm persistence. This recommendation follows the consensus statement: Recommendations for the Management of Subsolid Pulmonary Nodules Detected at CT: A Statement from the Fleischner Society as published in Radiology 2013; 266:304-317. 4. Hepatic steatosis. 5. Additional incidental findings, as above.   Electronically Signed   By: Trudie Reed M.D.   On: 05/14/2013 16:21   Dg Abd 1 View  05/12/2013   CLINICAL DATA:  Abdominal and back pain.  EXAM: ABDOMEN - 1 VIEW  COMPARISON:  None.  FINDINGS: The bowel gas pattern is normal. A 4 mm radiodensity is seen in the right hemipelvis, overlying the expected location of the distal right ureter. This is suspicious for small distal right ureteral calculus. No other radiopaque calculi identified.  IMPRESSION: Suspect 4 mm distal right ureteral calculus. Consider abdomen pelvis CT without contrast for further evaluation if clinically warranted.   Electronically Signed   By: Myles Rosenthal M.D.   On: 05/12/2013 16:44   Ct Chest W Contrast  05/21/2013   ADDENDUM REPORT: 05/21/2013 11:05  ADDENDUM: Upon further review, there is dilatation of the common bile duct and mild intrahepatic biliary ductal dilatation. This is better demonstrated on subsequent ultrasound 05/21/2013.   Electronically Signed   By: Trudie Reed M.D.   On: 05/21/2013  11:05   05/21/2013    CLINICAL DATA:  Lung nodules  EXAM: CT CHEST WITH CONTRAST  TECHNIQUE: Multidetector CT imaging of the chest was performed during intravenous contrast administration.  CONTRAST:  80mL OMNIPAQUE IOHEXOL 300 MG/ML  SOLN  COMPARISON:  [facility] abdomen and pelvis CT exam dated 05/14/2013.  FINDINGS: There is no axillary lymphadenopathy. Upper normal 7 mm short axis high right peritracheal lymph node is identified. No mediastinal or hilar lymphadenopathy. Heart size is normal. No pericardial or pleural effusion.  Lung windows show a 9 x 16 sub solid right lower lobe this may need pulmonary nodule on image 35. The 7 mm left lower lobe pulmonary nodule on image 35 is stable in the interval. 9 mm right lower lobe posteromedial nodule (image 40) is also stable  Subsegmental atelectasis is seen in the posterior right lower lobe.  Bone windows reveal no worrisome lytic or sclerotic osseous lesions. Images which include the upper abdomen show intra and extrahepatic biliary duct dilatation, as before, but this is incompletely visualized.  IMPRESSION: Multiple pulmonary nodules as described previously. The 3 nodules or seen in the lung bases on the previous study represent the only 3 nodule seen in the lungs on the CT of the chest. Initial followup CT at 3 months recommended to confirm persistence. Adenocarcinoma cannot be excluded. This recommendation follows the consensus statement: Recommendations for the Management of Subsolid Pulmonary Nodules Detected at CT: A Statement from the Fleischner Society as published in Radiology 2013; 266:304-317.  Intra and extrahepatic biliary duct dilatation, incompletely visualized.  Electronically Signed: By: Kennith Center M.D. On: 05/21/2013 09:39   US Abdomen Complete  05/21/2013   CLINICAL DATA:  Elevated liver function tests. Elevated bilirubin.  EXAM: ULTRASOUND ABDOMEN COMPLETE  COMPARISON:  CT scan dated 05/14/2013  FINDINGS: Gallbladder  There is marked to dilatation of  the intrahepatic and extrahepatic bile ducts. The common bile duct is dilated into the distal pancreatic head. There is no appreciable mass or stone. The gallbladder is distended and contains sludge but no visible stones.  Common bile duct  Diameter: 16 mm in diameter.  Liver  Liver parenchyma is echogenic consistent with hepatic steatosis. Single 9 mm cyst in the anterior aspect of the left lobe.  IVC  No abnormality visualized.  Pancreas  Visualized portion unremarkable.  Spleen  Size and appearance within normal limits.  8.0 cm in length.  Right Kidney  Length: 11.8 cm. Echogenicity within normal limits. No mass or hydronephrosis visualized.  Left Kidney  Length: 12.5 cm. Echogenicity within normal limits. No mass or hydronephrosis visualized.  Abdominal aorta  Normal. 2.4 cm maximum diameter.  IMPRESSION: Marked dilatation of the biliary tree. The common bile duct is dilated into the distal pancreatic head. No appreciable mass or stone. Sludge in the gallbladder. Findings are consistent with distal common bile duct obstruction.  Hepatic steatosis.   Electronically Signed   By: Geanie Cooley M.D.   On: 05/21/2013 08:28   Dg Ercp  05/21/2013   CLINICAL DATA:  Biliary obstruction.  EXAM: ERCP  TECHNIQUE: Multiple spot images obtained with the fluoroscopic device and submitted for interpretation post-procedure.  COMPARISON:  CT abdomen and pelvis 05/14/2013. Ultrasound from 05/21/2013.  FINDINGS: A stent has been placed across the area of biliary obstruction in the common bile duct. The biliary ductal dilatation is slightly decreased compared with the initial retrograde cholangiogram.  IMPRESSION: ERCP with stenting.  These images were submitted for radiologic interpretation only. Please see the  procedural report for the amount of contrast and the fluoroscopy time utilized.   Electronically Signed   By: Davonna Belling M.D.   On: 05/21/2013 19:14   GI Procedures:  1. ERCP with bile duct brushings and stent  placement 2. EUS with FNA 3. Pulmonary nodules   History/Physical Exam:  See Admission H&P  Admission HPI: Terry Byrd is a 57 y.o. male seen in our office two days ago for mid abdominal pain, weight loss. Patient had seen PCP who treated him empirically for diverticulitis. Patient failed to improve, non-contrast CTscan done and revealed diverticulosis without diverticulitis. Other findings included steatosis and pulmonary nodules. Patient was referred to [place]. LFTs found to be markedly abnormal, mixed pattern. We ordered an ultrasound which revealed gallbladder sludge, dilatation of the common bile duct and mild intrahepatic biliary ductal dilatation. Follow up chest CTscan with contrast again demonstrates multiple pulmonary nodules.  Patient admitted for pain control and further evaluation. Patient gives a 3-4 week history of constant mid to upper abdominal pain. He also hurts in his back. No significant nausea. He has lost about 13 pounds as eating does exacerbate the pain. No chills or fevers   Hospital Course by problem list: 1. Biliary obstruction. Patient was a direct admit for severe abdominal pain and bilary obstruction based on labs and imaging studies. This admission he underwent ERCP with findings of a CBD stricture which was brushed and stented with a plastic stent. Guide wire could not be passed through pancreatic duct raising suspicion of tumor.  The following day patient underwent EUS with FNA. No discrete pancreatic masses were seen but pancreas was diffusely abnormal. Pancreatic duct was not dilated. FNA showed atypical cells. Patient was discharged home following the EUS. He was scheduled for CTscan of the abdomen and pelvis (pancreatic protocol) the following day to look for presence of a pancreatic mass. Patient to follow up in the office with Dr. Marina Goodell on Monday.     2. Chronic pancreatitis, newly diagnosed by EUS. History of ETOH years ago.  3. Pulmonary nodules on CTscan.  Referral to pulmonary already in place.    Discharge Vitals:  BP 151/93  Pulse 75  Temp(Src) 98.5 F (36.9 C) (Oral)  Resp 15  Ht 5' 8.5" (1.74 m)  Wt 206 lb 11.2 oz (93.759 kg)  BMI 30.97 kg/m2  SpO2 97%  Physical Exam General: pleasant, well developed white male in NAD Cardiac: Regular rate and rhythm Pulmonary: Lungs CTA bilaterally Abdominal: soft, nondistended, nontender. Normal bowel sounds Extremity:no edema Neuro: alert and oriented Psych: pleasant and cooperative  Disposition and follow-up:   Terry Byrd was discharged from [facility] in stable condition.    Follow-up Appointments:  Future Appointments Provider Department Dept Phone   06/20/2013 2:45 PM Kalman Shan, MD [facility] [phone]      Discharge Medications:   Medication List    STOP taking these medications       BC FAST PAIN RELIEF PO      TAKE these medications       acetaminophen 500 MG tablet  Commonly known as:  TYLENOL  Take 500 mg by mouth every 6 (six) hours as needed for pain.     calcium carbonate 500 MG chewable tablet  Commonly known as:  TUMS - dosed in mg elemental calcium  Chew 1 tablet by mouth daily.     cyclobenzaprine 10 MG tablet  Commonly known as:  FLEXERIL  Take 10 mg by mouth  3 (three) times daily as needed for muscle spasms.     HYDROcodone-acetaminophen 5-325 MG per tablet  Commonly known as:  NORCO/VICODIN  Take 2 tablets by mouth every 6 (six) hours as needed for pain.     LORazepam 0.5 MG tablet  Commonly known as:  ATIVAN  Take 1 tablet (0.5 mg total) by mouth every 8 (eight) hours.     ondansetron 4 MG tablet  Commonly known as:  ZOFRAN  Take 1 tablet (4 mg total) by mouth every 6 (six) hours as needed for nausea.        Signed: Willette Cluster 05/22/2013, 3:40 PM

## 2013-05-22 NOTE — Interval H&P Note (Signed)
History and Physical Interval Note:  05/22/2013 11:36 AM  Terry Byrd  has presented today for surgery, with the diagnosis of pancreatic mass  The various methods of treatment have been discussed with the patient and family. After consideration of risks, benefits and other options for treatment, the patient has consented to  Procedure(s): ESOPHAGEAL ENDOSCOPIC ULTRASOUND (EUS) RADIAL (N/A) as a surgical intervention .  The patient's history has been reviewed, patient examined, no change in status, stable for surgery.  I have reviewed the patient's chart and labs.  Questions were answered to the patient's satisfaction.     Rachael Fee

## 2013-05-22 NOTE — Progress Notes (Signed)
Patient and wife given discharge instructions, with both verbalizing an understanding of discharge plan and instructions.  Patient given appointment information for CT scan tomorrow, and was given a phone number to call to set up picking up any contrast that may be needed for the test.  Philomena Doheny RN

## 2013-05-22 NOTE — Transfer of Care (Signed)
Immediate Anesthesia Transfer of Care Note  Patient: [name]  Procedure(s) Performed: Procedure(s): ESOPHAGEAL ENDOSCOPIC ULTRASOUND (EUS) RADIAL (N/A)  Patient Location: PACU  Anesthesia Type:MAC  Level of Consciousness: alert , oriented and patient cooperative  Airway & Oxygen Therapy: Patient Spontanous Breathing and Patient connected to nasal cannula oxygen  Post-op Assessment: Report given to PACU RN, Post -op Vital signs reviewed and stable and Patient moving all extremities  Post vital signs: Reviewed and stable  Complications: No apparent anesthesia complications

## 2013-05-22 NOTE — Anesthesia Preprocedure Evaluation (Addendum)
Anesthesia Evaluation  Patient identified by MRN, date of birth, ID band Patient awake    Reviewed: Allergy & Precautions, H&P , NPO status , Patient's Chart, lab work & pertinent test results  Airway Mallampati: II TM Distance: >3 FB Neck ROM: Full    Dental  (+) Teeth Intact and Dental Advisory Given   Pulmonary former smoker,  breath sounds clear to auscultation  Pulmonary exam normal       Cardiovascular negative cardio ROS  Rhythm:Regular Rate:Normal     Neuro/Psych negative neurological ROS  negative psych ROS   GI/Hepatic negative GI ROS, Neg liver ROS, (+)     substance abuse (Patient denies alcohol use for past 15 years.)  alcohol use,   Endo/Other  negative endocrine ROS  Renal/GU negative Renal ROS  negative genitourinary   Musculoskeletal negative musculoskeletal ROS (+)   Abdominal   Peds negative pediatric ROS (+)  Hematology negative hematology ROS (+)   Anesthesia Other Findings   Reproductive/Obstetrics negative OB ROS                          Anesthesia Physical Anesthesia Plan  ASA: II  Anesthesia Plan: MAC   Post-op Pain Management:    Induction: Intravenous  Airway Management Planned: Nasal Cannula  Additional Equipment:   Intra-op Plan:   Post-operative Plan:   Informed Consent: I have reviewed the patients History and Physical, chart, labs and discussed the procedure including the risks, benefits and alternatives for the proposed anesthesia with the patient or authorized representative who has indicated his/her understanding and acceptance.   Dental advisory given  Plan Discussed with: CRNA  Anesthesia Plan Comments:         Anesthesia Quick Evaluation

## 2013-05-22 NOTE — Progress Notes (Signed)
EUS does not confirm definite pancreatic mass. I have communicated with Dr Christella Hartigan. Pt will be discharged today and have a pancreatic protocol CT scan as an outpatient. Follow up appointment with Dr Marina Goodell.. Pain control. Repeat LFTs , amylase, lipase next week,

## 2013-05-22 NOTE — Transfer of Care (Signed)
Immediate Anesthesia Transfer of Care Note  Patient: Terry Byrd  Procedure(s) Performed: Procedure(s): ESOPHAGEAL ENDOSCOPIC ULTRASOUND (EUS) RADIAL (N/A)  Patient Location: PACU and Endoscopy Unit  Anesthesia Type:MAC  Level of Consciousness: awake, alert  and oriented  Airway & Oxygen Therapy: Patient Spontanous Breathing and Patient connected to nasal cannula oxygen  Post-op Assessment: Report given to PACU RN and Post -op Vital signs reviewed and stable  Post vital signs: Reviewed and stable  Complications: No apparent anesthesia complications

## 2013-05-22 NOTE — Op Note (Signed)
[facility] [address] [place], [address]   ENDOSCOPIC ULTRASOUND PROCEDURE REPORT PATIENT: Terry Byrd, Terry Byrd  MR#: [id number] BIRTHDATE: [birth year]  GENDER: Male ENDOSCOPIST: Rachael Fee, MD PROCEDURE DATE:  05/22/2013 PROCEDURE:   Upper EUS w/FNA ASA CLASS:      Class II INDICATIONS:   jaundice, mild abd pain, weight loss; ERCP yesterday with Dr.  Leone Payor found 3cm long CBD stricture, brushed (neg cytology) and stented. Non IV contrast CT scan 1 week ago does not show clear pancreatic mass. MEDICATIONS: MAC sedation, administered by CRNA DESCRIPTION OF PROCEDURE:   After the risks benefits and alternatives of the procedure were  explained, informed consent was obtained. The patient was then placed in the left, lateral, decubitus postion and IV sedation was administered. Throughout the procedure, the patients blood pressure, pulse and oxygen saturations were monitored continuously.  Under direct visualization, the Pentax Radial EUS [id number]  endoscope was introduced through the mouth  and advanced to the second portion of the duodenum .  Water was used as necessary to provide an acoustic interface.  Upon completion of the imaging, water was removed and the patient was sent to the recovery room in satisfactory condition.  Endoscopic findings: 1. The previously placed biliary stent was protruding into the duodenum across major papilla. EUS findings: 1. The pancreatic parenchyma was diffusely abnormal; there were echogenic strands, the parenchyma was honeycombed appearing. There were no clear pancreatic masses.  The parenchyma around the CBD, biliary stent was inspected carefully and there was no discrete masses, tumors.  I sampled the head of pancreas, around the CBD with 3 transduodenal passes of a 25 guage EUS FNA needle, suction. 2. Main pancreatic duct was non-dilated however its walls were more echogenic than usual. 3. Benign appearing periportal  adenopathy. 4. Gallbladder was normal. 5. The CBD and cystic duct walls were thicker than usual (2mm across).  Impression: There are parenchymal and ductal signs that suggest chronic pancreatitis.  These difuse parenchymal changes greatly reduce the sensitivity of EUS to detect pancreatic neoplasia.  The parenchyma around the CBD (with indwelling biliary stent) was sampled. Preliminary cytology review does not show obvious cancer.  The walls of the bile duct and cystic duct were thicker than normal (1-68mm), this raises question of biliary neoplasia.  He is OK to d/c home today.  He needs IV contrast, pancreatic protocol CT scan. If this fails to show clear mass, then would setup interval imaging with MRI/MRCP in 4 weeks and also plan to remove the existing biliary stent for repeat brushing.  _______________________________ eSigned:  Rachael Fee, MD 05/22/2013 2:33 PM  cc: Yancey Flemings, MD; Stan Head, MD

## 2013-05-23 ENCOUNTER — Telehealth: Payer: Self-pay | Admitting: Gastroenterology

## 2013-05-23 ENCOUNTER — Encounter (HOSPITAL_COMMUNITY): Payer: Self-pay | Admitting: Gastroenterology

## 2013-05-23 ENCOUNTER — Ambulatory Visit (INDEPENDENT_AMBULATORY_CARE_PROVIDER_SITE_OTHER)
Admit: 2013-05-23 | Discharge: 2013-05-23 | Disposition: A | Discharge status: Home / Self Care | Payer: Managed Care, Other (non HMO) | Attending: Internal Medicine | Admitting: Internal Medicine

## 2013-05-23 DIAGNOSIS — K869 Disease of pancreas, unspecified: Secondary | ICD-10-CM

## 2013-05-23 DIAGNOSIS — K8689 Other specified diseases of pancreas: Secondary | ICD-10-CM

## 2013-05-23 MED ORDER — IOHEXOL 350 MG/ML SOLN
100.0000 mL | Freq: Once | INTRAVENOUS | Status: AC | PRN
  Administered 2013-05-23: 100 mL via INTRAVENOUS

## 2013-05-23 NOTE — Telephone Encounter (Signed)
Pt notified that the results have not been received I will call as soon as available

## 2013-05-23 NOTE — Anesthesia Postprocedure Evaluation (Signed)
Anesthesia Post Note  Patient: Terry Byrd  Procedure(s) Performed: Procedure(s) (LRB): ESOPHAGEAL ENDOSCOPIC ULTRASOUND (EUS) RADIAL (N/A)  Anesthesia type: MAC  Patient location: PACU  Post pain: Pain level controlled  Post assessment: Post-op Vital signs reviewed  Last Vitals:  Filed Vitals:   05/22/13 1537  BP: 151/93  Pulse: 75  Temp: 36.9 C  Resp: 15    Post vital signs: Reviewed  Level of consciousness: sedated  Complications: No apparent anesthesia complications

## 2013-05-24 ENCOUNTER — Telehealth: Payer: Self-pay | Admitting: Internal Medicine

## 2013-05-24 ENCOUNTER — Encounter (HOSPITAL_COMMUNITY): Payer: Self-pay | Admitting: Emergency Medicine

## 2013-05-24 ENCOUNTER — Emergency Department (HOSPITAL_COMMUNITY): Payer: Managed Care, Other (non HMO)

## 2013-05-24 ENCOUNTER — Inpatient Hospital Stay (HOSPITAL_COMMUNITY)
Admission: EM | Admit: 2013-05-24 | Discharge: 2013-05-31 | DRG: 871 | Disposition: A | Discharge status: Home / Self Care | Payer: Managed Care, Other (non HMO) | Attending: Internal Medicine | Admitting: Internal Medicine

## 2013-05-24 DIAGNOSIS — I330 Acute and subacute infective endocarditis: Secondary | ICD-10-CM

## 2013-05-24 DIAGNOSIS — R7402 Elevation of levels of lactic acid dehydrogenase (LDH): Secondary | ICD-10-CM | POA: Diagnosis present

## 2013-05-24 DIAGNOSIS — K8689 Other specified diseases of pancreas: Secondary | ICD-10-CM | POA: Diagnosis present

## 2013-05-24 DIAGNOSIS — M62838 Other muscle spasm: Secondary | ICD-10-CM | POA: Diagnosis present

## 2013-05-24 DIAGNOSIS — R7989 Other specified abnormal findings of blood chemistry: Secondary | ICD-10-CM | POA: Diagnosis present

## 2013-05-24 DIAGNOSIS — A4181 Sepsis due to Enterococcus: Secondary | ICD-10-CM

## 2013-05-24 DIAGNOSIS — Z113 Encounter for screening for infections with a predominantly sexual mode of transmission: Secondary | ICD-10-CM

## 2013-05-24 DIAGNOSIS — R634 Abnormal weight loss: Secondary | ICD-10-CM

## 2013-05-24 DIAGNOSIS — A419 Sepsis, unspecified organism: Secondary | ICD-10-CM | POA: Diagnosis present

## 2013-05-24 DIAGNOSIS — Z79899 Other long term (current) drug therapy: Secondary | ICD-10-CM

## 2013-05-24 DIAGNOSIS — A409 Streptococcal sepsis, unspecified: Principal | ICD-10-CM | POA: Diagnosis present

## 2013-05-24 DIAGNOSIS — J189 Pneumonia, unspecified organism: Secondary | ICD-10-CM | POA: Diagnosis present

## 2013-05-24 DIAGNOSIS — K831 Obstruction of bile duct: Secondary | ICD-10-CM | POA: Diagnosis present

## 2013-05-24 DIAGNOSIS — J9819 Other pulmonary collapse: Secondary | ICD-10-CM | POA: Diagnosis present

## 2013-05-24 DIAGNOSIS — Z87891 Personal history of nicotine dependence: Secondary | ICD-10-CM

## 2013-05-24 DIAGNOSIS — K861 Other chronic pancreatitis: Secondary | ICD-10-CM | POA: Diagnosis present

## 2013-05-24 DIAGNOSIS — K8309 Other cholangitis: Secondary | ICD-10-CM

## 2013-05-24 DIAGNOSIS — M542 Cervicalgia: Secondary | ICD-10-CM

## 2013-05-24 DIAGNOSIS — G8929 Other chronic pain: Secondary | ICD-10-CM | POA: Diagnosis present

## 2013-05-24 DIAGNOSIS — R932 Abnormal findings on diagnostic imaging of liver and biliary tract: Secondary | ICD-10-CM

## 2013-05-24 DIAGNOSIS — R7881 Bacteremia: Secondary | ICD-10-CM

## 2013-05-24 DIAGNOSIS — K838 Other specified diseases of biliary tract: Secondary | ICD-10-CM

## 2013-05-24 DIAGNOSIS — R7401 Elevation of levels of liver transaminase levels: Secondary | ICD-10-CM | POA: Diagnosis present

## 2013-05-24 LAB — CBC WITH DIFFERENTIAL/PLATELET
Basophils Absolute: 0 10*3/uL (ref 0.0–0.1)
Eosinophils Relative: 0 % (ref 0–5)
HCT: 43 % (ref 39.0–52.0)
Hemoglobin: 14.5 g/dL (ref 13.0–17.0)
Lymphocytes Relative: 6 % — ABNORMAL LOW (ref 12–46)
Lymphs Abs: 0.6 10*3/uL — ABNORMAL LOW (ref 0.7–4.0)
MCV: 89.4 fL (ref 78.0–100.0)
Monocytes Absolute: 0.4 10*3/uL (ref 0.1–1.0)
Monocytes Relative: 4 % (ref 3–12)
RDW: 14.7 % (ref 11.5–15.5)
WBC: 10 10*3/uL (ref 4.0–10.5)

## 2013-05-24 LAB — COMPREHENSIVE METABOLIC PANEL
Alkaline Phosphatase: 405 U/L — ABNORMAL HIGH (ref 39–117)
CO2: 23 mEq/L (ref 19–32)
Calcium: 8.8 mg/dL (ref 8.4–10.5)
Chloride: 101 mEq/L (ref 96–112)
Creatinine, Ser: 0.9 mg/dL (ref 0.50–1.35)
GFR calc Af Amer: >90 mL/min (ref >90)
GFR calc non Af Amer: >90 mL/min (ref >90)
Glucose, Bld: 125 mg/dL — ABNORMAL HIGH (ref 70–99)

## 2013-05-24 LAB — LACTIC ACID, PLASMA: Lactic Acid, Venous: 1.6 mmol/L (ref 0.5–2.2)

## 2013-05-24 LAB — LIPASE, BLOOD: Lipase: 66 U/L — ABNORMAL HIGH (ref 11–59)

## 2013-05-24 MED ORDER — CIPROFLOXACIN IN D5W 400 MG/200ML IV SOLN
400.0000 mg | Freq: Once | INTRAVENOUS | Status: AC
  Administered 2013-05-24: 400 mg via INTRAVENOUS
  Filled 2013-05-24: qty 200

## 2013-05-24 MED ORDER — ENOXAPARIN SODIUM 40 MG/0.4ML ~~LOC~~ SOLN
40.0000 mg | SUBCUTANEOUS | Status: DC
  Administered 2013-05-25 – 2013-05-31 (×7): 40 mg via SUBCUTANEOUS
  Filled 2013-05-24 (×7): qty 0.4

## 2013-05-24 MED ORDER — HYDROMORPHONE 0.3 MG/ML IV SOLN
INTRAVENOUS | Status: DC
  Administered 2013-05-25: 2.59 mg via INTRAVENOUS
  Administered 2013-05-25 (×2): via INTRAVENOUS
  Administered 2013-05-25: 4.39 mg via INTRAVENOUS
  Administered 2013-05-25: 1.59 mg via INTRAVENOUS
  Administered 2013-05-25: 11:00:00 1.79 mg via INTRAVENOUS
  Administered 2013-05-26: 5.59 mg via INTRAVENOUS
  Administered 2013-05-26: 2.59 mg via INTRAVENOUS
  Administered 2013-05-26: 1.59 mg via INTRAVENOUS
  Administered 2013-05-26 (×2): via INTRAVENOUS
  Administered 2013-05-26: 2.59 mg via INTRAVENOUS
  Administered 2013-05-27: 3.19 mg via INTRAVENOUS
  Administered 2013-05-27: 0.599 mg via INTRAVENOUS
  Administered 2013-05-27: 3.19 mg via INTRAVENOUS
  Administered 2013-05-27: 0.799 mg via INTRAVENOUS
  Administered 2013-05-27: 1.59 mg via INTRAVENOUS
  Administered 2013-05-27: 0.3 mg via INTRAVENOUS
  Administered 2013-05-27: 0.599 mg via INTRAVENOUS
  Administered 2013-05-27: 05:00:00 via INTRAVENOUS
  Administered 2013-05-28: 10:00:00 0.3 mg via INTRAVENOUS
  Administered 2013-05-28: 2.57 mg via INTRAVENOUS
  Filled 2013-05-24 (×7): qty 25

## 2013-05-24 MED ORDER — ACETAMINOPHEN 325 MG PO TABS: 650.0000 mg | ORAL_TABLET | Freq: Four times a day (QID) | ORAL | Status: DC | PRN

## 2013-05-24 MED ORDER — SODIUM CHLORIDE 0.9 % IV SOLN
500.0000 mg | Freq: Three times a day (TID) | INTRAVENOUS | Status: DC
  Administered 2013-05-25 (×2): 500 mg via INTRAVENOUS
  Filled 2013-05-24 (×3): qty 500

## 2013-05-24 MED ORDER — SODIUM CHLORIDE 0.9 % IJ SOLN
3.0000 mL | Freq: Two times a day (BID) | INTRAMUSCULAR | Status: DC
  Administered 2013-05-25 – 2013-05-29 (×8): 3 mL via INTRAVENOUS

## 2013-05-24 MED ORDER — METRONIDAZOLE IN NACL 5-0.79 MG/ML-% IV SOLN
500.0000 mg | Freq: Once | INTRAVENOUS | Status: AC
  Administered 2013-05-24: 500 mg via INTRAVENOUS
  Filled 2013-05-24: qty 100

## 2013-05-24 MED ORDER — SODIUM CHLORIDE 0.9 % IV SOLN
INTRAVENOUS | Status: DC
  Administered 2013-05-25 (×2): via INTRAVENOUS
  Administered 2013-05-27: 16:00:00 100 mL/h via INTRAVENOUS
  Administered 2013-05-27: 02:00:00 via INTRAVENOUS
  Administered 2013-05-28: 75 mL/h via INTRAVENOUS
  Administered 2013-05-29 – 2013-05-31 (×2): via INTRAVENOUS

## 2013-05-24 MED ORDER — SODIUM CHLORIDE 0.9 % IV BOLUS (SEPSIS)
1000.0000 mL | Freq: Once | INTRAVENOUS | Status: AC
  Administered 2013-05-24: 1000 mL via INTRAVENOUS

## 2013-05-24 MED ORDER — HYDROMORPHONE HCL PF 2 MG/ML IJ SOLN
4.0000 mg | Freq: Once | INTRAMUSCULAR | Status: AC
  Administered 2013-05-24: 4 mg via INTRAVENOUS
  Filled 2013-05-24: qty 2

## 2013-05-24 MED ORDER — IOHEXOL 300 MG/ML  SOLN
50.0000 mL | Freq: Once | INTRAMUSCULAR | Status: AC | PRN
  Administered 2013-05-24: 50 mL via ORAL

## 2013-05-24 MED ORDER — HYDROMORPHONE HCL PF 1 MG/ML IJ SOLN
1.0000 mg | Freq: Once | INTRAMUSCULAR | Status: AC
  Administered 2013-05-24: 1 mg via INTRAVENOUS
  Filled 2013-05-24: qty 1

## 2013-05-24 MED ORDER — IOHEXOL 300 MG/ML  SOLN
100.0000 mL | Freq: Once | INTRAMUSCULAR | Status: AC | PRN
  Administered 2013-05-24: 100 mL via INTRAVENOUS

## 2013-05-24 MED ORDER — NALOXONE HCL 0.4 MG/ML IJ SOLN
INTRAMUSCULAR | Status: AC
  Administered 2013-05-24: 0.4 mg
  Filled 2013-05-24: qty 1

## 2013-05-24 MED ORDER — ACETAMINOPHEN 500 MG PO TABS
1000.0000 mg | ORAL_TABLET | Freq: Once | ORAL | Status: AC
  Administered 2013-05-24: 1000 mg via ORAL
  Filled 2013-05-24: qty 2

## 2013-05-24 MED ORDER — NALOXONE HCL 0.4 MG/ML IJ SOLN: 0.4000 mg | Freq: Once | INTRAMUSCULAR | Status: AC

## 2013-05-24 MED ORDER — HYDROMORPHONE HCL PF 2 MG/ML IJ SOLN
2.0000 mg | Freq: Once | INTRAMUSCULAR | Status: AC
  Administered 2013-05-24: 2 mg via INTRAVENOUS
  Filled 2013-05-24: qty 1

## 2013-05-24 MED ORDER — ONDANSETRON HCL 4 MG PO TABS
4.0000 mg | ORAL_TABLET | Freq: Four times a day (QID) | ORAL | Status: DC | PRN
  Administered 2013-05-30 (×2): 4 mg via ORAL
  Filled 2013-05-24 (×2): qty 1

## 2013-05-24 MED ORDER — DIAZEPAM 5 MG/ML IJ SOLN
2.5000 mg | Freq: Once | INTRAMUSCULAR | Status: AC
  Administered 2013-05-24: 2.5 mg via INTRAVENOUS
  Filled 2013-05-24: qty 2

## 2013-05-24 MED ORDER — ACETAMINOPHEN 650 MG RE SUPP: 650.0000 mg | Freq: Four times a day (QID) | RECTAL | Status: DC | PRN

## 2013-05-24 MED ORDER — ONDANSETRON HCL 4 MG/2ML IJ SOLN
4.0000 mg | Freq: Once | INTRAMUSCULAR | Status: AC
  Administered 2013-05-24: 4 mg via INTRAVENOUS
  Filled 2013-05-24: qty 2

## 2013-05-24 MED ORDER — ONDANSETRON HCL 4 MG/2ML IJ SOLN
4.0000 mg | Freq: Four times a day (QID) | INTRAMUSCULAR | Status: DC | PRN
  Administered 2013-05-25 – 2013-05-28 (×6): 4 mg via INTRAVENOUS
  Filled 2013-05-24 (×6): qty 2

## 2013-05-24 MED ORDER — NALOXONE HCL 1 MG/ML IJ SOLN
INTRAMUSCULAR | Status: AC
  Filled 2013-05-24: qty 2

## 2013-05-24 NOTE — ED Notes (Signed)
Per MD [place]off[place] pt may have PO tylenol for fever

## 2013-05-24 NOTE — ED Notes (Signed)
pts wife demanded

## 2013-05-24 NOTE — ED Provider Notes (Signed)
CSN: [id number]     Arrival date & time 05/24/13  1725 History   First MD Initiated Contact with Patient 05/24/13 1728     Chief Complaint  Patient presents with  . Abdominal Pain   (Consider location/radiation/quality/duration/timing/severity/associated sxs/prior Treatment) Patient is a 57 y.o. male presenting with abdominal pain.  Abdominal Pain Pain location:  Generalized Pain quality comment:  Pressure Pain radiates to:  Does not radiate Pain severity:  Severe Onset quality:  Gradual Duration: subacute, worse today. Timing:  Constant Progression:  Worsening Context comment:  Recent biliary stenting Relieved by:  Nothing Worsened by:  Movement Ineffective treatments: oral narcotics. Associated symptoms: constipation   Associated symptoms: no chest pain, no cough, no diarrhea, no fever, no nausea, no shortness of breath and no vomiting     Past Medical History  Diagnosis Date  . Substance abuse     alcohol abuse, quit in 1998  . Neck pain, chronic   . Diverticulosis 08/11/2008  . Inguinal hernia   . Bile duct stricture    Past Surgical History  Procedure Laterality Date  . Inguinal hernia repair Right     age 82  . Vasectomy    . Colonoscopy  08-11-08    per Dr. Lina Sar, normal , repeat in 5 yrs   . Tonsillectomy and adenoidectomy    . Ercp N/A 05/21/2013    Procedure: ENDOSCOPIC RETROGRADE CHOLANGIOPANCREATOGRAPHY (ERCP);  Surgeon: Iva Boop, MD;  Location: [facility];  Service: Endoscopy;  Laterality: N/A;  . Eus N/A 05/22/2013    Procedure: ESOPHAGEAL ENDOSCOPIC ULTRASOUND (EUS) RADIAL;  Surgeon: Rachael Fee, MD;  Location: [facility];  Service: Endoscopy;  Laterality: N/A;   Family History  Problem Relation Age of Onset  . Diabetes Father   . Colon cancer Mother    History  Substance Use Topics  . Smoking status: Former Smoker    Types: Cigarettes    Quit date: 08/08/2011  . Smokeless tobacco: Never Used  . Alcohol Use: No     Comment:  quit 1998    Review of Systems  Constitutional: Negative for fever.  HENT: Negative for congestion.   Respiratory: Negative for cough and shortness of breath.   Cardiovascular: Negative for chest pain.  Gastrointestinal: Positive for abdominal pain and constipation. Negative for nausea, vomiting and diarrhea.  All other systems reviewed and are negative.    Allergies  Codeine and Penicillins  Home Medications   Current Outpatient Rx  Name  Route  Sig  Dispense  Refill  . acetaminophen (TYLENOL) 500 MG tablet   Oral   Take 500 mg by mouth every 6 (six) hours as needed for pain.         . calcium carbonate (TUMS - DOSED IN MG ELEMENTAL CALCIUM) 500 MG chewable tablet   Oral   Chew 1 tablet by mouth daily.         . cyclobenzaprine (FLEXERIL) 10 MG tablet   Oral   Take 10 mg by mouth 3 (three) times daily as needed for muscle spasms.         Marland Kitchen HYDROcodone-acetaminophen (NORCO/VICODIN) 5-325 MG per tablet   Oral   Take 2 tablets by mouth every 6 (six) hours as needed for pain.   30 tablet   0   . LORazepam (ATIVAN) 0.5 MG tablet   Oral   Take 1 tablet (0.5 mg total) by mouth every 8 (eight) hours.   30 tablet   0   .  ondansetron (ZOFRAN) 4 MG tablet   Oral   Take 1 tablet (4 mg total) by mouth every 6 (six) hours as needed for nausea.   30 tablet   0    BP 134/88  Pulse 94  Temp(Src) 98.5 F (36.9 C) (Oral)  Resp 18  SpO2 97% Physical Exam  Nursing note and vitals reviewed. Constitutional: He is oriented to person, place, and time. He appears well-developed and well-nourished. He appears distressed.  HENT:  Head: Normocephalic and atraumatic.  Mouth/Throat: Oropharynx is clear and moist.  Eyes: Conjunctivae are normal. Pupils are equal, round, and reactive to light. No scleral icterus.  Neck: Neck supple.  Cardiovascular: Normal rate, regular rhythm, normal heart sounds and intact distal pulses.   No murmur heard. Pulmonary/Chest: Effort normal and  breath sounds normal. No stridor. No respiratory distress. He has no wheezes. He has no rales.  Abdominal: Soft. He exhibits no distension. There is generalized tenderness. There is guarding.  Musculoskeletal: Normal range of motion. He exhibits no edema.  Neurological: He is alert and oriented to person, place, and time.  Skin: Skin is warm and dry. No rash noted.  Jaundiced  Psychiatric: He has a normal mood and affect. His behavior is normal.    ED Course  CRITICAL CARE Performed by: Blake Divine DAVID Authorized by: Blake Divine DAVID Total critical care time: 45 minutes Critical care time was exclusive of separately billable procedures and treating other patients. Critical care was necessary to treat or prevent imminent or life-threatening deterioration of the following conditions: respiratory failure and sepsis. Critical care was time spent personally by me on the following activities: development of treatment plan with patient or surrogate, discussions with consultants, evaluation of patient's response to treatment, examination of patient, obtaining history from patient or surrogate, ordering and performing treatments and interventions, ordering and review of laboratory studies, ordering and review of radiographic studies, pulse oximetry, re-evaluation of patient's condition and review of old charts.   (including critical care time) Labs Review Labs Reviewed - No data to display Imaging Review Ct Abdomen Pelvis W Wo Contrast  05/23/2013   CLINICAL DATA:  Evaluate for pancreas mass  EXAM: CT ABDOMEN AND PELVIS WITHOUT AND WITH CONTRAST  TECHNIQUE: Multidetector CT imaging of the abdomen and pelvis was performed without contrast material in one or both body regions, followed by contrast material(s) and further sections in one or both body regions.  CONTRAST:  OMNIPAQUE IOHEXOL 350 MG/ML SOLN  COMPARISON:  05/14/2013  FINDINGS: The lung bases are clear.  Interval placement of  stress set there is a fluid attenuating structure within the periphery of the right hepatic lobe measuring 1 cm. The gallbladder wall stress set there is enhancement of the gallbladder wall which appears mildly prominent. Interval placement of common bile duct stent with decompression of dilated intrahepatic ducts. Pneumobilia is identified compatible with biliary patency. The pancreatic duct has a normal caliber. The uncinate process demonstrates mild hypoenhancement when compared with the pancreatic head, body and tail. The fat plane between the uncinate process in the SMA appears decreased, image 45/ series 5. No discrete mass however is identified. The spleen is normal.  The adrenal glands are both unremarkable. Normal appearance of the kidneys. The urinary bladder appears normal. The prostate gland and seminal vesicles are unremarkable.  There is a normal caliber of the abdominal aorta. No upper abdominal adenopathy identified. No pelvic or inguinal adenopathy identified.  The stomach and small bowel loops have a normal course  and caliber. The proximal colon appears normal. Multiple distal colonic diverticula identified.  Review of the visualized osseous structures demonstrates no aggressive lytic or sclerotic bone lesion.  IMPRESSION: 1. There is relative hypoenhancement of the uncinate process of the pancreas when compared with the head, body and tail of pancreas. Additionally, there is loss of the fat plane between the uncinate process of the pancreas and the superior mesenteric artery, image 42/series 5. These may be indirect findings of adenocarcinoma of the pancreas. However, no discrete mass however is identified.  2. There is no upper abdominal adenopathy noted.  3. Right hepatic lobe cyst.   Electronically Signed   By: Signa Kell M.D.   On: 05/23/2013 15:38   Ct Abdomen Pelvis W Contrast  05/24/2013   CLINICAL DATA:  Abdominal pain.  EXAM: CT ABDOMEN AND PELVIS WITH CONTRAST  TECHNIQUE:  Multidetector CT imaging of the abdomen and pelvis was performed using the standard protocol following bolus administration of intravenous contrast.  CONTRAST:  50mL OMNIPAQUE IOHEXOL 300 MG/ML SOLN, OMNIPAQUE IOHEXOL 300 MG/ML SOLN  COMPARISON:  05/23/2013 a  FINDINGS: There is increasing intrahepatic and extrahepatic biliary ductal dilatation since yesterday's study. The endo biliary stent appears to have migrated distally somewhat. The tip remains in the mid to distal common bile duct then courses into the transverse duodenum. Pneumobilia. Mild distention of the gallbladder with gallbladder wall thickening.  No well-defined pancreatic mass/lesion. No pancreatic ductal dilatation. Small scattered hypodensities in the liver are stable, likely small cysts. Spleen, pancreas, adrenals and kidneys are unremarkable. Small low-density area within the midpole of the right kidney anteriorly, likely small cyst. No hydronephrosis.  Sigmoid diverticulosis. No active diverticulitis. Small bowel is decompressed. No free fluid, free air or adenopathy. Urinary bladder is unremarkable. Appendix is visualized and is normal.  Aorta and iliac vessels are calcified, non aneurysmal.  No acute bony abnormality.  IMPRESSION: Worsening intrahepatic and extrahepatic biliary ductal dilatation. Increasing distention of the gallbladder with mild gallbladder wall thickening. The biliary stent appears to migrated slightly distally but remains in the mid to distal common bile duct and extending into the transverse duodenum.  Sigmoid diverticulosis.   Electronically Signed   By: Charlett Nose M.D.   On: 05/24/2013 20:59   Dg Chest Port 1 View  05/24/2013   CLINICAL DATA:  Abdominal pain, shortness of breath  EXAM: PORTABLE CHEST - 1 VIEW  COMPARISON:  05/22/2009  FINDINGS: Heart is normal size. Patchy left perihilar and lower lobe atelectasis or infiltrate. Right lung is clear. No visible effusion. No acute bony abnormality.  IMPRESSION:  Question early infiltrate versus atelectasis in the left perihilar and lower lobe regions.   Electronically Signed   By: Charlett Nose M.D.   On: 05/24/2013 18:42  All radiology studies independently viewed by me.     EKG Interpretation   None       MDM   1. Acute cholangitis   2. Obstruction of bile duct   3. Common bile duct stricture   4. Obstructive jaundice   5. Nonspecific (abnormal) findings on radiological and other examination of biliary tract   6. Sepsis   7. 19    57 yo male with recent biliary stenting and workup for possible pancreatic cancer presenting with severe abdominal pain.  Described as similar in character to the pain he had during his recent hospitalization, but worse in severity.  Given multiple doses of IV dilaudid with incomplete effect.  Lab workup consistent with prior  values.  No leukocytosis.  CT pending.  GI consulted and involved in care as well.  They recommend Cipro/Flagyl due to his recent abdominal procedure.    CT showed biliary dilation and migration of his stent.  Meanwhile, pt became tachycardic and febrile.  Blood pressures remained adequate.  I discussed his CT findings with Dr. Marina Goodell (his primary gastroenterologist) and Dr. Juanda Chance (GI on call) who both felt that repeat ERCP in the morning after fluid resuscitation and antibiotics was the most appropriate course of action.  Meanwhile, his pain remained extremely difficult to adequately control.  After his last ED dose of pain medication, he became apneic and unresponsive.  His respirations responded to jaw thrust performed by me, supplemental O2 via nonrebreather, and IV narcan.  This complication was communicated to his admitting MD prior to his subsequent admission.    Candyce Churn, MD 05/24/13 2351

## 2013-05-24 NOTE — Telephone Encounter (Signed)
Spoke with Inocencio Homes (wife) who contacted me with questions about Hal. Still with significant pain. No BM in a few days. ? Urine dark again. Recommended miralax for bowels, advised on pain medication and antiemetic use, and discussed indications to go to ER (uncontrolled pain, fever, recurrent jaundice). Made her aware that Dr Juanda Chance is on call for GI this weekend.

## 2013-05-24 NOTE — ED Notes (Signed)
MD Wofford aware of patient and family request for pain medication, pt family requesting to speak with MD when MD states no medication to be ordered at this time. MD states he will be to bedside soon.

## 2013-05-24 NOTE — ED Notes (Signed)
pts wife demanded I go in providers room to get the doctor, so that she wouldn't have to go in there herself, bc it would comfort her.  Consistently ask if we can move faster.

## 2013-05-24 NOTE — ED Notes (Addendum)
Pt was seen here 2 days ago, wife reports he may have pancreatic cancer. Pt in extreme pain.

## 2013-05-24 NOTE — Telephone Encounter (Signed)
i have seen Mr Terry Byrd in [facility] with severe pain, jaundice, chills, I am currently awaiting labs, KUB, CT scan. Pain control oredered. Admission in process.

## 2013-05-24 NOTE — ED Notes (Signed)
Patient and family updated, family remains concerned about patient pain level, MD aware, no new orders at this time

## 2013-05-24 NOTE — ED Notes (Signed)
Pt family expressed further concern about patient pain level and fever, pt continues to request to speak with MD, MD aware of request and asked again to go to patient bedside.

## 2013-05-24 NOTE — ED Notes (Signed)
Pt placed on 2L O2 via [place] when VS were checked, O2 level improved with this

## 2013-05-24 NOTE — Consult Note (Signed)
[facility] Consultation  Referring Provider: none Primary Care Physician:  [name], MD Primary Gastroenterologist:  Dr.John Marina Goodell  Reason for Consultation:  Severe abdominal pain x several hours, vomting  HPI:    Terry Byrd is a 57 y.o. male who was discharged 3 days ago after evaluation for extrahapatic biliary obstruction, which was attributed to a distal CBC stricture. CT scan of tha abdomen as well as ERCP with placement of a biliary stent and subsequent EUS by Dr Christella Hartigan suggested  Chronic  pancreatitis. Cytologies from the CBD brushings were negative, FNA showed atypical cells. Ca19-9. CT scan of the abdomen c/w fatty liver. Pt was supposed to have follow up LFT's next week and appointm with Dr Marina Goodell but never became pain free, his jaundice did not subside and he became very sick with chills later this afternoon. Wife called Dr Marina Goodell and myself from ER to see him immediately. No BM's.Positive F hx of colon cancer in a parent- colon 4 years ago.   Past Medical History  Diagnosis Date  . Substance abuse     alcohol abuse, quit in 1998  . Neck pain, chronic   . Diverticulosis 08/11/2008  . Inguinal hernia   . Bile duct stricture     Past Surgical History  Procedure Laterality Date  . Inguinal hernia repair Right     age 25  . Vasectomy    . Colonoscopy  08-11-08    per Dr. Lina Sar, normal , repeat in 5 yrs   . Tonsillectomy and adenoidectomy    . Ercp N/A 05/21/2013    Procedure: ENDOSCOPIC RETROGRADE CHOLANGIOPANCREATOGRAPHY (ERCP);  Surgeon: Iva Boop, MD;  Location: [facility];  Service: Endoscopy;  Laterality: N/A;  . Eus N/A 05/22/2013    Procedure: ESOPHAGEAL ENDOSCOPIC ULTRASOUND (EUS) RADIAL;  Surgeon: Rachael Fee, MD;  Location: [facility];  Service: Endoscopy;  Laterality: N/A;    Prior to Admission medications   Medication Sig Start Date End Date Taking? Authorizing Provider  acetaminophen (TYLENOL) 500 MG tablet Take 500 mg  by mouth every 6 (six) hours as needed for pain.   Yes Historical Provider, MD  calcium carbonate (TUMS - DOSED IN MG ELEMENTAL CALCIUM) 500 MG chewable tablet Chew 1 tablet by mouth daily as needed for heartburn.    Yes Historical Provider, MD  cyclobenzaprine (FLEXERIL) 10 MG tablet Take 10 mg by mouth 3 (three) times daily as needed for muscle spasms.   Yes Historical Provider, MD  HYDROcodone-acetaminophen (NORCO/VICODIN) 5-325 MG per tablet Take 2 tablets by mouth every 6 (six) hours as needed for pain. 05/22/13  Yes Meredith Pel, NP  LORazepam (ATIVAN) 0.5 MG tablet Take 0.5 mg by mouth every 8 (eight) hours. 05/22/13  Yes Meredith Pel, NP  ondansetron (ZOFRAN) 4 MG tablet Take 1 tablet (4 mg total) by mouth every 6 (six) hours as needed for nausea. 05/22/13  Yes Meredith Pel, NP    Current Facility-Administered Medications  Medication Dose Route Frequency Provider Last Rate Last Dose  . metroNIDAZOLE (FLAGYL) IVPB 500 mg  500 mg Intravenous Once Candyce Churn, MD       Current Outpatient Prescriptions  Medication Sig Dispense Refill  . acetaminophen (TYLENOL) 500 MG tablet Take 500 mg by mouth every 6 (six) hours as needed for pain.      . calcium carbonate (TUMS - DOSED IN MG ELEMENTAL CALCIUM) 500 MG chewable tablet Chew 1 tablet by mouth daily as needed for heartburn.       Marland Kitchen  cyclobenzaprine (FLEXERIL) 10 MG tablet Take 10 mg by mouth 3 (three) times daily as needed for muscle spasms.      Marland Kitchen HYDROcodone-acetaminophen (NORCO/VICODIN) 5-325 MG per tablet Take 2 tablets by mouth every 6 (six) hours as needed for pain.  30 tablet  0  . LORazepam (ATIVAN) 0.5 MG tablet Take 0.5 mg by mouth every 8 (eight) hours.      . ondansetron (ZOFRAN) 4 MG tablet Take 1 tablet (4 mg total) by mouth every 6 (six) hours as needed for nausea.  30 tablet  0    Allergies as of 05/24/2013 - Review Complete 05/24/2013  Allergen Reaction Noted  . Codeine  03/26/2007  . Penicillins Rash  03/26/2007    Family History  Problem Relation Age of Onset  . Diabetes Father   . Colon cancer Mother     History   Social History  . Marital Status: Married    Spouse Name: N/A    Number of Children: 2  . Years of Education: N/A   Occupational History  . sales rep    Social History Main Topics  . Smoking status: Former Smoker    Types: Cigarettes    Quit date: 08/08/2011  . Smokeless tobacco: Never Used  . Alcohol Use: No     Comment: quit 1998  . Drug Use: No  . Sexual Activity: No   Other Topics Concern  . Not on file   Social History Narrative  . No narrative on file    Review of Systems: All other systems reviewed and negative except where noted in HPI.    Physical Exam:   Vital signs in last 24 hours: Temp:  [98.5 F (36.9 C)] 98.5 F (36.9 C) (10/18 1727) Pulse Rate:  [94-118] 118 (10/18 1924) Resp:  [18-19] 19 (10/18 1924) BP: (134-156)/(88-94) 156/94 mmHg (10/18 1924) SpO2:  [88 %-97 %] 88 % (10/18 1924)   General:   Alert,  Well-developed, well-nourished, acutely distressed  Head:  Normocephalic and atraumatic. Eyes:  Sclera icteric.   Conjunctiva pink. Ears:  Normal auditory acuity. Nose:  No deformity, discharge,  or lesions. Mouth:  No deformity or lesions.  Oropharynx pink & moist. Neck:  Supple; no masses or thyromegaly. Lungs:  Clear throughout to auscultation.   No wheezes, crackles, or rhonchi. No acute distress. Heart:  Regular rate and rhythm; no murmurs, clicks, rubs,  or gallops. Abdomen:  Distended, tense, quiet bowl sounds, no fluid wave hepatosplenomegaly or hernias noted. Normal bowel sounds, without guarding, and without rebound.   Rectal: not done   Msk:  Symmetrical without gross deformities. Pulses:  Normal pulses noted. Extremities:  Without clubbing or edema. Neurologic:  Alert and  oriented x4;  grossly normal neurologically. Skin:  Intact without significant lesions or rashes. Cervical Nodes:  No significant  cervical adenopathy. Psych:  Alert and cooperative.in severe pain.  Intake/Output from previous day:   Intake/Output this shift:    Lab Results:  Recent Labs  05/24/13 1935  WBC 10.0  HGB 14.5  HCT 43.0  PLT 230   BMET  Recent Labs  05/24/13 1935  NA 135  K 4.2  CL 101  CO2 23  GLUCOSE 125*  BUN 21  CREATININE 0.90  CALCIUM 8.8   LFT  Recent Labs  05/24/13 1935  PROT 6.8  ALBUMIN 3.4*  AST 451*  ALT 656*  ALKPHOS 405*  BILITOT 7.5*   PT/INR No results found for this basename: LABPROT, INR,  in the  last 72 hours Hepatitis Panel No results found for this basename: HEPBSAG, HCVAB, HEPAIGM, HEPBIGM,  in the last 72 hours  Studies/Results: Ct Abdomen Pelvis W Wo Contrast  05/23/2013   CLINICAL DATA:  Evaluate for pancreas mass  EXAM: CT ABDOMEN AND PELVIS WITHOUT AND WITH CONTRAST  TECHNIQUE: Multidetector CT imaging of the abdomen and pelvis was performed without contrast material in one or both body regions, followed by contrast material(s) and further sections in one or both body regions.  CONTRAST:  OMNIPAQUE IOHEXOL 350 MG/ML SOLN  COMPARISON:  05/14/2013  FINDINGS: The lung bases are clear.  Interval placement of stress set there is a fluid attenuating structure within the periphery of the right hepatic lobe measuring 1 cm. The gallbladder wall stress set there is enhancement of the gallbladder wall which appears mildly prominent. Interval placement of common bile duct stent with decompression of dilated intrahepatic ducts. Pneumobilia is identified compatible with biliary patency. The pancreatic duct has a normal caliber. The uncinate process demonstrates mild hypoenhancement when compared with the pancreatic head, body and tail. The fat plane between the uncinate process in the SMA appears decreased, image 45/ series 5. No discrete mass however is identified. The spleen is normal.  The adrenal glands are both unremarkable. Normal appearance of the kidneys.  The urinary bladder appears normal. The prostate gland and seminal vesicles are unremarkable.  There is a normal caliber of the abdominal aorta. No upper abdominal adenopathy identified. No pelvic or inguinal adenopathy identified.  The stomach and small bowel loops have a normal course and caliber. The proximal colon appears normal. Multiple distal colonic diverticula identified.  Review of the visualized osseous structures demonstrates no aggressive lytic or sclerotic bone lesion.  IMPRESSION: 1. There is relative hypoenhancement of the uncinate process of the pancreas when compared with the head, body and tail of pancreas. Additionally, there is loss of the fat plane between the uncinate process of the pancreas and the superior mesenteric artery, image 42/series 5. These may be indirect findings of adenocarcinoma of the pancreas. However, no discrete mass however is identified.  2. There is no upper abdominal adenopathy noted.  3. Right hepatic lobe cyst.   Electronically Signed   By: Signa Kell M.D.   On: 05/23/2013 15:38   Ct Abdomen Pelvis W Contrast  05/24/2013   CLINICAL DATA:  Abdominal pain.  EXAM: CT ABDOMEN AND PELVIS WITH CONTRAST  TECHNIQUE: Multidetector CT imaging of the abdomen and pelvis was performed using the standard protocol following bolus administration of intravenous contrast.  CONTRAST:  50mL OMNIPAQUE IOHEXOL 300 MG/ML SOLN, OMNIPAQUE IOHEXOL 300 MG/ML SOLN  COMPARISON:  05/23/2013 a  FINDINGS: There is increasing intrahepatic and extrahepatic biliary ductal dilatation since yesterday's study. The endo biliary stent appears to have migrated distally somewhat. The tip remains in the mid to distal common bile duct then courses into the transverse duodenum. Pneumobilia. Mild distention of the gallbladder with gallbladder wall thickening.  No well-defined pancreatic mass/lesion. No pancreatic ductal dilatation. Small scattered hypodensities in the liver are stable, likely small  cysts. Spleen, pancreas, adrenals and kidneys are unremarkable. Small low-density area within the midpole of the right kidney anteriorly, likely small cyst. No hydronephrosis.  Sigmoid diverticulosis. No active diverticulitis. Small bowel is decompressed. No free fluid, free air or adenopathy. Urinary bladder is unremarkable. Appendix is visualized and is normal.  Aorta and iliac vessels are calcified, non aneurysmal.  No acute bony abnormality.  IMPRESSION: Worsening intrahepatic and extrahepatic biliary  ductal dilatation. Increasing distention of the gallbladder with mild gallbladder wall thickening. The biliary stent appears to migrated slightly distally but remains in the mid to distal common bile duct and extending into the transverse duodenum.  Sigmoid diverticulosis.   Electronically Signed   By: Charlett Nose M.D.   On: 05/24/2013 20:59   Dg Chest Port 1 View  05/24/2013   CLINICAL DATA:  Abdominal pain, shortness of breath  EXAM: PORTABLE CHEST - 1 VIEW  COMPARISON:  05/22/2009  FINDINGS: Heart is normal size. Patchy left perihilar and lower lobe atelectasis or infiltrate. Right lung is clear. No visible effusion. No acute bony abnormality.  IMPRESSION: Question early infiltrate versus atelectasis in the left perihilar and lower lobe regions.   Electronically Signed   By: Charlett Nose M.D.   On: 05/24/2013 18:42     Previous Endoscopies: See HPI  Impression / Plan:   Acute severe abdominal pain , 3 days post biliary stent placement  for CBD obstruction, so far no evidence of malignancy, r/o stent obstruction, possible cholangitis, Chronic pancreatitis by EUS, r/o acute pancreatitis, lipase unchanged, WBC  Ct scan evidence of increasing intrahepatic duct dilation but at the same time Improvement in serum bilirubin since 2 days ago, will need a follow up imaging and monitoring of the bilirubin and LFT's, no evidence of intraperitoneal air or fluid collection Cipro/Flagyl IV- allergic to  PCN Vigorous IV hydration as if in acute pancreatitis, while monitoring  PO2 ( FiO2 88 %), Consider possibility of a stent exchange  Or irrigation to assure patency , he has pneumobilia  Secondary to sphincterotomy Pain control- may need dilaudid pump    LOS: 0 days   Lina Sar  05/24/2013, 9:06 PM

## 2013-05-24 NOTE — H&P (Addendum)
[facility] History and Physical  Terry Byrd WGN:[id number] DOB: Jul 24, 1956 DOA: 05/24/2013   PCP: [name], MD   Chief Complaint: Uncontrolled abdominal pain  HPI:  57 year old male presents to the emergency department with a one-day history of worsening abdominal pain in the epigastric and periumbilical region. The patient was recently discharged from the hospital on 05/22/2013 after admission for workup of abdominal pain. Abdominal ultrasound on 05/21/2013 revealed dilated biliary tree with gallbladder sludge. The patient's hepatic enzymes and bilirubin were elevated at that time. The patient underwent ERCP on 05/21/2013 during which a stent was placed in the common bile duct. Endoscopic ultrasound was performed on 05/22/2013. During the ultrasound, the patient's pancreatic parenchyma and ductal parenchyma suggested chronic pancreatitis. The patient was discharged home with Norco on 05/22/13. On the morning of this admission, the patient has significant increase of his abdominal pain. As result he came to the ED for further evaluation. He denied any fevers or chills at home, but the patient had a temperature of 102.20F in the ED. He denied any chest discomfort, shortness breath, vomiting, diarrhea, hematochezia, melena, dysuria, hematuria.  In the ED, the patient was given 6 mg of hydromorphone With only 10-20% relief of his abdominal pain. AST was 451, ALT 66, alk phosphatase 405 , total bilirubin 7.5, and lipase 66. He was hemodynamically stable. CT of the abdomen and pelvis showed increasing intra-and extrahepatic ductal dilatation since his CAT scan on 05/23/2013. His biliary stent has also migrated distally with tip remaining in the mid to distal common bile duct. There was mild distended gallbladder with gallbladder wall thickening. The patient was given 1 L normal saline and started on Cipro and Flagyl. WBC was 10.0. BMP was unremarkable. Lactic acid 1.6. The patient was noted  to have temperature 100.5, tachycardia 115  Assessment/Plan: Sepsis -Secondary to acute cholangitis -present at the time of admission -IVF Acute cholangitis -Secondary to biliary stent migration -Blood cultures x2 sets--please note that blood cultures were obtained after the patient was given Cipro and Flagyl -Start vancomycin and imipenem pending culture data -UA with reflex urine culture -GI has already seen the patient--plan for repeat ERCP/stent in am -I have consulted general surgery--spoke with Dr. Gerrit Friends Uncontrolled pain -Start the patient on Dilaudid PCA pump-- 0.2mg /hr bolus q 16min--max 4.8mg  every 4 hours -Valium 5 mg every 8 hours when necessary muscle spasm Transaminasemia -likely due to biliary stent migration Pulmonary infiltrate/atelectasis -Concerned about HCAP -vanc/imipenem as discussed       Past Medical History  Diagnosis Date  . Substance abuse     alcohol abuse, quit in 1998  . Neck pain, chronic   . Diverticulosis 08/11/2008  . Inguinal hernia   . Bile duct stricture    Past Surgical History  Procedure Laterality Date  . Inguinal hernia repair Right     age 89  . Vasectomy    . Colonoscopy  08-11-08    per Dr. Lina Sar, normal , repeat in 5 yrs   . Tonsillectomy and adenoidectomy    . Ercp N/A 05/21/2013    Procedure: ENDOSCOPIC RETROGRADE CHOLANGIOPANCREATOGRAPHY (ERCP);  Surgeon: Iva Boop, MD;  Location: [facility];  Service: Endoscopy;  Laterality: N/A;  . Eus N/A 05/22/2013    Procedure: ESOPHAGEAL ENDOSCOPIC ULTRASOUND (EUS) RADIAL;  Surgeon: Rachael Fee, MD;  Location: [facility];  Service: Endoscopy;  Laterality: N/A;   Social History:  reports that he quit smoking about 21 months ago. His smoking use included Cigarettes. He smoked  0.00 packs per day. He has never used smokeless tobacco. He reports that he does not drink alcohol or use illicit drugs.   Family History  Problem Relation Age of Onset  . Diabetes Father    . Colon cancer Mother      Allergies  Allergen Reactions  . Codeine     REACTION: nausea/vomiting  . Penicillins Rash      Prior to Admission medications   Medication Sig Start Date End Date Taking? Authorizing Provider  acetaminophen (TYLENOL) 500 MG tablet Take 500 mg by mouth every 6 (six) hours as needed for pain.   Yes Historical Provider, MD  calcium carbonate (TUMS - DOSED IN MG ELEMENTAL CALCIUM) 500 MG chewable tablet Chew 1 tablet by mouth daily as needed for heartburn.    Yes Historical Provider, MD  cyclobenzaprine (FLEXERIL) 10 MG tablet Take 10 mg by mouth 3 (three) times daily as needed for muscle spasms.   Yes Historical Provider, MD  HYDROcodone-acetaminophen (NORCO/VICODIN) 5-325 MG per tablet Take 2 tablets by mouth every 6 (six) hours as needed for pain. 05/22/13  Yes Meredith Pel, NP  LORazepam (ATIVAN) 0.5 MG tablet Take 0.5 mg by mouth every 8 (eight) hours. 05/22/13  Yes Meredith Pel, NP  ondansetron (ZOFRAN) 4 MG tablet Take 1 tablet (4 mg total) by mouth every 6 (six) hours as needed for nausea. 05/22/13  Yes Meredith Pel, NP    Review of Systems:  Constitutional:  No weight loss, night sweats Head&Eyes: No headache.  No vision loss.   ENT:  No Difficulty swallowing,Tooth/dental problems,Sore throat,   Cardio-vascular:  No chest pain, Orthopnea, PND, swelling in lower extremities,  dizziness, palpitations  GI:  No   vomiting, diarrhea, loss of appetite, hematochezia, melena, heartburn, indigestion, Resp:  No shortness of breath with exertion or at rest. No cough. No coughing up of blood  Skin:  no rash or lesions.  GU:  no dysuria, change in color of urine, no urgency or frequency.  Musculoskeletal:  No joint pain or swelling. No decreased range of motion.  Psych:  No change in mood or affect. Neurologic: No headache, no dysesthesia, no focal weakness, no vision loss. No syncope  Physical Exam: Filed Vitals:   05/24/13 1727  05/24/13 1924 05/24/13 2100 05/24/13 2119  BP: 134/88 156/94    Pulse: 94 118 124   Temp: 98.5 F (36.9 C)   102.5 F (39.2 C)  TempSrc: Oral   Oral  Resp: 18 19 20    SpO2: 97% 88% 92%    General:  A&O x 3, NAD, nontoxic, pleasant/cooperative Head/Eye: No conjunctival hemorrhage, + icterus, Rogers/AT, No nystagmus ENT:  + icterus,  No thrush, good dentition, no pharyngeal exudate Neck:  No masses, no lymphadenpathy, no bruits CV:  RRR, no rub, no gallop, no S3 Lung:  CTAB, good air movement, no wheeze, no rhonchi Abdomen: soft/epigastric and periumbilical tenderness without any guarding, +BS, nondistended, no peritoneal signs Ext: No cyanosis, No rashes, No petechiae, No lymphangitis, No edema   Labs on Admission:  Basic Metabolic Panel:  Recent Labs Lab 05/19/13 1525 05/21/13 1225 05/24/13 1935  NA 139 134* 135  K 3.9 3.9 4.2  CL 101 102 101  CO2 30 25 23   GLUCOSE 94 102* 125*  BUN 19 17 21   CREATININE 0.9 0.95 0.90  CALCIUM 9.2 8.9 8.8   Liver Function Tests:  Recent Labs Lab 05/19/13 1525 05/21/13 1225 05/24/13 1935  AST 268* 228* 451*  ALT 625* 594* 656*  ALKPHOS 298* 347* 405*  BILITOT 9.8* 9.0* 7.5*  PROT 6.9 6.6 6.8  ALBUMIN 3.7 3.4* 3.4*    Recent Labs Lab 05/19/13 1525 05/24/13 1935  LIPASE 51.0 66*   No results found for this basename: AMMONIA,  in the last 168 hours CBC:  Recent Labs Lab 05/19/13 1525 05/24/13 1935  WBC 6.3 10.0  NEUTROABS 3.9 8.9*  HGB 14.4 14.5  HCT 42.3 43.0  MCV 87.8 89.4  PLT 259.0 230   Cardiac Enzymes: No results found for this basename: CKTOTAL, CKMB, CKMBINDEX, TROPONINI,  in the last 168 hours BNP: No components found with this basename: POCBNP,  CBG: No results found for this basename: GLUCAP,  in the last 168 hours  Radiological Exams on Admission: Ct Abdomen Pelvis W Wo Contrast  05/23/2013   CLINICAL DATA:  Evaluate for pancreas mass  EXAM: CT ABDOMEN AND PELVIS WITHOUT AND WITH CONTRAST   TECHNIQUE: Multidetector CT imaging of the abdomen and pelvis was performed without contrast material in one or both body regions, followed by contrast material(s) and further sections in one or both body regions.  CONTRAST:  OMNIPAQUE IOHEXOL 350 MG/ML SOLN  COMPARISON:  05/14/2013  FINDINGS: The lung bases are clear.  Interval placement of stress set there is a fluid attenuating structure within the periphery of the right hepatic lobe measuring 1 cm. The gallbladder wall stress set there is enhancement of the gallbladder wall which appears mildly prominent. Interval placement of common bile duct stent with decompression of dilated intrahepatic ducts. Pneumobilia is identified compatible with biliary patency. The pancreatic duct has a normal caliber. The uncinate process demonstrates mild hypoenhancement when compared with the pancreatic head, body and tail. The fat plane between the uncinate process in the SMA appears decreased, image 45/ series 5. No discrete mass however is identified. The spleen is normal.  The adrenal glands are both unremarkable. Normal appearance of the kidneys. The urinary bladder appears normal. The prostate gland and seminal vesicles are unremarkable.  There is a normal caliber of the abdominal aorta. No upper abdominal adenopathy identified. No pelvic or inguinal adenopathy identified.  The stomach and small bowel loops have a normal course and caliber. The proximal colon appears normal. Multiple distal colonic diverticula identified.  Review of the visualized osseous structures demonstrates no aggressive lytic or sclerotic bone lesion.  IMPRESSION: 1. There is relative hypoenhancement of the uncinate process of the pancreas when compared with the head, body and tail of pancreas. Additionally, there is loss of the fat plane between the uncinate process of the pancreas and the superior mesenteric artery, image 42/series 5. These may be indirect findings of adenocarcinoma of the  pancreas. However, no discrete mass however is identified.  2. There is no upper abdominal adenopathy noted.  3. Right hepatic lobe cyst.   Electronically Signed   By: Signa Kell M.D.   On: 05/23/2013 15:38   Ct Abdomen Pelvis W Contrast  05/24/2013   CLINICAL DATA:  Abdominal pain.  EXAM: CT ABDOMEN AND PELVIS WITH CONTRAST  TECHNIQUE: Multidetector CT imaging of the abdomen and pelvis was performed using the standard protocol following bolus administration of intravenous contrast.  CONTRAST:  50mL OMNIPAQUE IOHEXOL 300 MG/ML SOLN, OMNIPAQUE IOHEXOL 300 MG/ML SOLN  COMPARISON:  05/23/2013 a  FINDINGS: There is increasing intrahepatic and extrahepatic biliary ductal dilatation since yesterday's study. The endo biliary stent appears to have migrated distally somewhat. The tip remains in the mid to  distal common bile duct then courses into the transverse duodenum. Pneumobilia. Mild distention of the gallbladder with gallbladder wall thickening.  No well-defined pancreatic mass/lesion. No pancreatic ductal dilatation. Small scattered hypodensities in the liver are stable, likely small cysts. Spleen, pancreas, adrenals and kidneys are unremarkable. Small low-density area within the midpole of the right kidney anteriorly, likely small cyst. No hydronephrosis.  Sigmoid diverticulosis. No active diverticulitis. Small bowel is decompressed. No free fluid, free air or adenopathy. Urinary bladder is unremarkable. Appendix is visualized and is normal.  Aorta and iliac vessels are calcified, non aneurysmal.  No acute bony abnormality.  IMPRESSION: Worsening intrahepatic and extrahepatic biliary ductal dilatation. Increasing distention of the gallbladder with mild gallbladder wall thickening. The biliary stent appears to migrated slightly distally but remains in the mid to distal common bile duct and extending into the transverse duodenum.  Sigmoid diverticulosis.   Electronically Signed   By: Charlett Nose M.D.    On: 05/24/2013 20:59   Dg Chest Port 1 View  05/24/2013   CLINICAL DATA:  Abdominal pain, shortness of breath  EXAM: PORTABLE CHEST - 1 VIEW  COMPARISON:  05/22/2009  FINDINGS: Heart is normal size. Patchy left perihilar and lower lobe atelectasis or infiltrate. Right lung is clear. No visible effusion. No acute bony abnormality.  IMPRESSION: Question early infiltrate versus atelectasis in the left perihilar and lower lobe regions.   Electronically Signed   By: Charlett Nose M.D.   On: 05/24/2013 18:42        Time spent:70 minutes Code Status:   FULL Family Communication:  Wife and Family at bedside   Cuca Benassi, DO  [facility] Pager [phone]  If 7PM-7AM, please contact night-coverage [email or web address] Password [email or web address] 05/24/2013, 10:40 PM

## 2013-05-25 ENCOUNTER — Other Ambulatory Visit (HOSPITAL_COMMUNITY): Payer: Managed Care, Other (non HMO)

## 2013-05-25 ENCOUNTER — Inpatient Hospital Stay (HOSPITAL_COMMUNITY): Payer: Managed Care, Other (non HMO)

## 2013-05-25 DIAGNOSIS — K8309 Other cholangitis: Secondary | ICD-10-CM

## 2013-05-25 DIAGNOSIS — R7989 Other specified abnormal findings of blood chemistry: Secondary | ICD-10-CM

## 2013-05-25 LAB — CBC WITH DIFFERENTIAL/PLATELET
Basophils Absolute: 0 10*3/uL (ref 0.0–0.1)
Eosinophils Absolute: 0 10*3/uL (ref 0.0–0.7)
Eosinophils Relative: 0 % (ref 0–5)
HCT: 39.9 % (ref 39.0–52.0)
Hemoglobin: 13.4 g/dL (ref 13.0–17.0)
Lymphocytes Relative: 2 % — ABNORMAL LOW (ref 12–46)
Lymphs Abs: 0.3 10*3/uL — ABNORMAL LOW (ref 0.7–4.0)
MCHC: 33.6 g/dL (ref 30.0–36.0)
MCV: 90.7 fL (ref 78.0–100.0)
Monocytes Absolute: 1.1 10*3/uL — ABNORMAL HIGH (ref 0.1–1.0)
Monocytes Relative: 7 % (ref 3–12)
Neutro Abs: 13.6 10*3/uL — ABNORMAL HIGH (ref 1.7–7.7)
RBC: 4.4 MIL/uL (ref 4.22–5.81)
WBC: 15 10*3/uL — ABNORMAL HIGH (ref 4.0–10.5)

## 2013-05-25 LAB — COMPREHENSIVE METABOLIC PANEL
AST: 436 U/L — ABNORMAL HIGH (ref 0–37)
BUN: 23 mg/dL (ref 6–23)
CO2: 21 mEq/L (ref 19–32)
Calcium: 9 mg/dL (ref 8.4–10.5)
Creatinine, Ser: 1.09 mg/dL (ref 0.50–1.35)
GFR calc Af Amer: 85 mL/min — ABNORMAL LOW (ref >90)
GFR calc non Af Amer: 74 mL/min — ABNORMAL LOW (ref >90)
Glucose, Bld: 132 mg/dL — ABNORMAL HIGH (ref 70–99)
Potassium: 4 mEq/L (ref 3.5–5.1)

## 2013-05-25 LAB — URINALYSIS W MICROSCOPIC + REFLEX CULTURE
Hgb urine dipstick: NEGATIVE
Nitrite: NEGATIVE
Specific Gravity, Urine: 1.046 — ABNORMAL HIGH (ref 1.005–1.030)
Urobilinogen, UA: >8 mg/dL — ABNORMAL HIGH (ref 0.0–1.0)

## 2013-05-25 LAB — HEPATIC FUNCTION PANEL
ALT: 748 U/L — ABNORMAL HIGH (ref 0–53)
Alkaline Phosphatase: 339 U/L — ABNORMAL HIGH (ref 39–117)
Bilirubin, Direct: 8.3 mg/dL — ABNORMAL HIGH (ref 0.0–0.3)
Indirect Bilirubin: 1.9 mg/dL — ABNORMAL HIGH (ref 0.3–0.9)

## 2013-05-25 LAB — RAPID URINE DRUG SCREEN, HOSP PERFORMED
Barbiturates: NOT DETECTED
Benzodiazepines: NOT DETECTED

## 2013-05-25 MED ORDER — SODIUM CHLORIDE 0.9 % IV SOLN
500.0000 mg | Freq: Four times a day (QID) | INTRAVENOUS | Status: DC
  Administered 2013-05-25 – 2013-05-31 (×26): 500 mg via INTRAVENOUS
  Filled 2013-05-25 (×28): qty 500

## 2013-05-25 MED ORDER — VANCOMYCIN HCL IN DEXTROSE 1-5 GM/200ML-% IV SOLN
1000.0000 mg | Freq: Three times a day (TID) | INTRAVENOUS | Status: DC
  Administered 2013-05-25 – 2013-05-27 (×7): 1000 mg via INTRAVENOUS
  Filled 2013-05-25 (×8): qty 200

## 2013-05-25 MED ORDER — CALCIUM CARBONATE ANTACID 500 MG PO CHEW
2.0000 | CHEWABLE_TABLET | Freq: Three times a day (TID) | ORAL | Status: DC | PRN
  Administered 2013-05-25 – 2013-05-29 (×2): 400 mg via ORAL
  Filled 2013-05-25 (×2): qty 2

## 2013-05-25 MED ORDER — SODIUM CHLORIDE 0.9 % IV SOLN: INTRAVENOUS | Status: DC

## 2013-05-25 NOTE — Progress Notes (Signed)
Spoke with Dr. Blake Divine for update on patient.  Pt had apnea episode after 4mg  Dilaudid and valium 2.5mg --no loss of pulse.  Pt was given narcan 0.4mg  and woke up and was alert and conversant.  When I evaluated the patient around 1145, he was awake and conversant.  Denied any cp, sob, n/v.  C/o abdominal pain but not as bad as earlier in evening.   97.7-117-16-117/64--92% on 2 liters A&O x 3 CV--RRR CTA, no wheeze, good air movement Abd--soft, +BS, epigastric tender/periumbilical tender without guarding or peritoneal sign EXT--no rash, no edema  I discontinued valium.  I removed basal rate from dilaudid PCA pump and decreased bolus to 0.2mg  every with max 4.8mg  every 4 hours.  I updated wife at the bedside.  DTat

## 2013-05-25 NOTE — Progress Notes (Signed)
Patient still unable to void on own. As discussed with Dr. Suanne Marker earlier today should patient still be unable to void, will place coude catheter. Erskin Burnet RN

## 2013-05-25 NOTE — Consult Note (Signed)
General Surgery [facility]  Patient discussed with Dr. Arbutus Leas by telephone this evening.  Chart reviewed and labs and CT scan reviewed.  Agree with plans for repeat ERCP and repeat stent placement, IV abx's.  Full consult to follow.  General surgery will follow and discuss with Dr. Almond Lint regarding further evaluation of the possible pancreatic mass.  Velora Heckler, MD, [facility] Office: [phone]

## 2013-05-25 NOTE — Progress Notes (Signed)
Pt unable to void, despite multiple attempts to void. Bladder scanned done .  Daphane Shepherd, NP notified.  New order for I&O cath x1. I&O cath output .  Will continue to monitor. Newman Nip [place]

## 2013-05-25 NOTE — Progress Notes (Signed)
[facility] Progress Note   Subjective  Feeling much better, unable to void, pain controlled on Dilaudid pump,    Objective  Pt admitted last night with severe abd . suspected cholangitis, CT scan showed dilated IH ducts but total bili was 7.5, which was better than 2 days before, also WBC was normal,  He was started initially on Cipro and Flagyl bur switched to Imipenem and Vancomycin, Blood cultures are pending. We are suspecting partial occlusion of the biliary stent, today Bili 8.9.Abdomen much softer. Wife at bedside Vital signs in last 24 hours: Temp:  [97.7 F (36.5 C)-102.5 F (39.2 C)] 97.7 F (36.5 C) (10/19 0557) Pulse Rate:  [68-125] 68 (10/19 0557) Resp:  [14-24] 14 (10/19 0800) BP: (117-156)/(64-96) 123/68 mmHg (10/19 0557) SpO2:  [88 %-97 %] 96 % (10/19 0800) Weight:  [210 lb 11.2 oz (95.573 kg)] 210 lb 11.2 oz (95.573 kg) (10/18 2346) Last BM Date: 05/21/13 General:  male in NAD, jaundiced Heart:  Regular rate and rhythm; no murmurs Lungs: Respirations even and unlabored, lungs CTA bilaterally, fine inspiratory rales Abdomen:  Soft, nontender and nondistended. Normal bowel sounds.no fluid wave,  Extremities:  Without edema. Neurologic:  Alert and oriented,  grossly normal neurologically. Psych:  Cooperative. Normal mood and affect.appears drowsy  Intake/Output from previous day: 10/18 0701 - 10/19 0700 In: 950 [I.V.:650; IV Piggyback:300] Out: 950 [Urine:950] Intake/Output this shift:    Lab Results:  Recent Labs  05/24/13 1935 05/25/13 0510  WBC 10.0 15.0*  HGB 14.5 13.4  HCT 43.0 39.9  PLT 230 179   BMET  Recent Labs  05/24/13 1935 05/25/13 0510  NA 135 135  K 4.2 4.0  CL 101 101  CO2 23 21  GLUCOSE 125* 132*  BUN 21 23  CREATININE 0.90 1.09  CALCIUM 8.8 9.0   LFT  Recent Labs  05/25/13 0510  PROT 5.8*  ALBUMIN 2.8*  AST 436*  ALT 652*  ALKPHOS 326*  BILITOT 8.9*   PT/INR No results found for this basename:  LABPROT, INR,  in the last 72 hours  Studies/Results: Ct Abdomen Pelvis W Wo Contrast  05/23/2013   CLINICAL DATA:  Evaluate for pancreas mass  EXAM: CT ABDOMEN AND PELVIS WITHOUT AND WITH CONTRAST  TECHNIQUE: Multidetector CT imaging of the abdomen and pelvis was performed without contrast material in one or both body regions, followed by contrast material(s) and further sections in one or both body regions.  CONTRAST:  OMNIPAQUE IOHEXOL 350 MG/ML SOLN  COMPARISON:  05/14/2013  FINDINGS: The lung bases are clear.  Interval placement of stress set there is a fluid attenuating structure within the periphery of the right hepatic lobe measuring 1 cm. The gallbladder wall stress set there is enhancement of the gallbladder wall which appears mildly prominent. Interval placement of common bile duct stent with decompression of dilated intrahepatic ducts. Pneumobilia is identified compatible with biliary patency. The pancreatic duct has a normal caliber. The uncinate process demonstrates mild hypoenhancement when compared with the pancreatic head, body and tail. The fat plane between the uncinate process in the SMA appears decreased, image 45/ series 5. No discrete mass however is identified. The spleen is normal.  The adrenal glands are both unremarkable. Normal appearance of the kidneys. The urinary bladder appears normal. The prostate gland and seminal vesicles are unremarkable.  There is a normal caliber of the abdominal aorta. No upper abdominal adenopathy identified. No pelvic or inguinal adenopathy identified.  The stomach and small bowel loops  have a normal course and caliber. The proximal colon appears normal. Multiple distal colonic diverticula identified.  Review of the visualized osseous structures demonstrates no aggressive lytic or sclerotic bone lesion.  IMPRESSION: 1. There is relative hypoenhancement of the uncinate process of the pancreas when compared with the head, body and tail of pancreas.  Additionally, there is loss of the fat plane between the uncinate process of the pancreas and the superior mesenteric artery, image 42/series 5. These may be indirect findings of adenocarcinoma of the pancreas. However, no discrete mass however is identified.  2. There is no upper abdominal adenopathy noted.  3. Right hepatic lobe cyst.   Electronically Signed   By: Signa Kell M.D.   On: 05/23/2013 15:38   Ct Abdomen Pelvis W Contrast  05/24/2013   CLINICAL DATA:  Abdominal pain.  EXAM: CT ABDOMEN AND PELVIS WITH CONTRAST  TECHNIQUE: Multidetector CT imaging of the abdomen and pelvis was performed using the standard protocol following bolus administration of intravenous contrast.  CONTRAST:  50mL OMNIPAQUE IOHEXOL 300 MG/ML SOLN, OMNIPAQUE IOHEXOL 300 MG/ML SOLN  COMPARISON:  05/23/2013 a  FINDINGS: There is increasing intrahepatic and extrahepatic biliary ductal dilatation since yesterday's study. The endo biliary stent appears to have migrated distally somewhat. The tip remains in the mid to distal common bile duct then courses into the transverse duodenum. Pneumobilia. Mild distention of the gallbladder with gallbladder wall thickening.  No well-defined pancreatic mass/lesion. No pancreatic ductal dilatation. Small scattered hypodensities in the liver are stable, likely small cysts. Spleen, pancreas, adrenals and kidneys are unremarkable. Small low-density area within the midpole of the right kidney anteriorly, likely small cyst. No hydronephrosis.  Sigmoid diverticulosis. No active diverticulitis. Small bowel is decompressed. No free fluid, free air or adenopathy. Urinary bladder is unremarkable. Appendix is visualized and is normal.  Aorta and iliac vessels are calcified, non aneurysmal.  No acute bony abnormality.  IMPRESSION: Worsening intrahepatic and extrahepatic biliary ductal dilatation. Increasing distention of the gallbladder with mild gallbladder wall thickening. The biliary stent appears  to migrated slightly distally but remains in the mid to distal common bile duct and extending into the transverse duodenum.  Sigmoid diverticulosis.   Electronically Signed   By: Charlett Nose M.D.   On: 05/24/2013 20:59   Dg Chest Port 1 View  05/24/2013   CLINICAL DATA:  Abdominal pain, shortness of breath  EXAM: PORTABLE CHEST - 1 VIEW  COMPARISON:  05/22/2009  FINDINGS: Heart is normal size. Patchy left perihilar and lower lobe atelectasis or infiltrate. Right lung is clear. No visible effusion. No acute bony abnormality.  IMPRESSION: Question early infiltrate versus atelectasis in the left perihilar and lower lobe regions.   Electronically Signed   By: Charlett Nose M.D.   On: 05/24/2013 18:42       Assessment:  Clinically much improved this am, probably had cholangitis due to partially obstructed or partially displaced biliary stent,  He has pneumobilia which indicates at least partial stent patency. Since he is better I would prefer to wait till tomorrow to have ERCP  By Dr Russella Dar who will be our hospital GI MD. We will monitor LFT's, continue antibiotics, follow I&O's.  Continue Dilaudid pump for now Long term disposition still not clear, Dr Marina Goodell will participate,     Plan:  As above  Active Problems:   Pancreatic mass   Obstruction of bile duct   Sepsis   Acute cholangitis   Transaminasemia     LOS: 1  day   Lina Sar  05/25/2013, 9:22 AM

## 2013-05-25 NOTE — Progress Notes (Signed)
Pt reassessed this afternoon, , he remains afebrile,  Pain about 4-5/10, total bili up to 10.0, CXR- atelectasis left base, Blood cultures from last night positive for Gram positive organisms. We are still planning ERCP for tomorrow.Continue the same

## 2013-05-25 NOTE — Progress Notes (Signed)
Pt blood culture is gram +w with cocci in chains. RN notified and MD.

## 2013-05-25 NOTE — Progress Notes (Signed)
[facility] PROGRESS NOTE  Terry Byrd AVW:[id number] DOB: 02-Nov-1955 DOA: 05/24/2013 PCP: [name], MD  Assessment/Plan: GPC Sepsis  -Prelim cultures with GPC anerobes -Secondary to acute cholangitis  -continue IVF, BP stable  -obtain echo to eval for vegetations, follow and consult ID Acute cholangitis  -Secondary to biliary stent migration  -Blood cultures x2 sets--( noted that blood cultures were obtained after the patient was given Cipro and Flagyl) +GP anerobes as above -continue vancomycin and imipenem culture ID and sens -UA neg -appreciate GI assistance-plan for repeat ERCP/stent in am, but due to increase in pain compared to this am LFTs repeated and if T.bili worsening to doing ERCP today per GI -appreciate general surgery-per Dr. Gerrit Friends family states they are to see surgeon at [facility] ([place]) Uncontrolled pain  -Secondary to above -continue Dilaudid PCA pump- and follow -Valium 5 mg every 8 hours when necessary muscle spasm  Elevated LFTs -likely due to biliary stent migration  Probable HCAP   -continue empiric vanc/imipenem as above and follow      Code Status: FULL Family Communication: Wife at bedside  Disposition Plan: to home when medically ready   Consultants:  GI  Procedures:  none  Antibiotics:  vanc and imipenem started 10/18  HPI/Subjective: States abd pain about 4-5/10; events of last pm noted resp supression/apneic episode following 4mg  Dilaudid and valium 2.5mg --no loss of pulse>>resolved after narcan.   Objective: Filed Vitals:   05/25/13 1030  BP:   Pulse:   Temp:   Resp: 14    Intake/Output Summary (Last 24 hours) at 05/25/13 1211 Last data filed at 05/25/13 0700  Gross per 24 hour  Intake    950 ml  Output    950 ml  Net      0 ml   Filed Weights   05/24/13 2346  Weight: 95.573 kg (210 lb 11.2 oz)   Exam:  General: alert & oriented x 3 In NAD Cardiovascular: RRR, nl S1 s2 Respiratory: CTAB Abdomen: soft +BS  +epigastic tenderness/ND, no masses palpable Extremities: No cyanosis and no edema    Data Reviewed: Basic Metabolic Panel:  Recent Labs Lab 05/19/13 1525 05/21/13 1225 05/24/13 1935 05/25/13 0510  NA 139 134* 135 135  K 3.9 3.9 4.2 4.0  CL 101 102 101 101  CO2 30 25 23 21   GLUCOSE 94 102* 125* 132*  BUN 19 17 21 23   CREATININE 0.9 0.95 0.90 1.09  CALCIUM 9.2 8.9 8.8 9.0   Liver Function Tests:  Recent Labs Lab 05/19/13 1525 05/21/13 1225 05/24/13 1935 05/25/13 0510  AST 268* 228* 451* 436*  ALT 625* 594* 656* 652*  ALKPHOS 298* 347* 405* 326*  BILITOT 9.8* 9.0* 7.5* 8.9*  PROT 6.9 6.6 6.8 5.8*  ALBUMIN 3.7 3.4* 3.4* 2.8*    Recent Labs Lab 05/19/13 1525 05/24/13 1935  LIPASE 51.0 66*   No results found for this basename: AMMONIA,  in the last 168 hours CBC:  Recent Labs Lab 05/19/13 1525 05/24/13 1935 05/25/13 0510  WBC 6.3 10.0 15.0*  NEUTROABS 3.9 8.9* 13.6*  HGB 14.4 14.5 13.4  HCT 42.3 43.0 39.9  MCV 87.8 89.4 90.7  PLT 259.0 230 179   Cardiac Enzymes: No results found for this basename: CKTOTAL, CKMB, CKMBINDEX, TROPONINI,  in the last 168 hours BNP (last 3 results) No results found for this basename: PROBNP,  in the last 8760 hours CBG: No results found for this basename: GLUCAP,  in the last 168 hours  No results  found for this or any previous visit (from the past 240 hour(s)).   Studies: Ct Abdomen Pelvis W Wo Contrast  05/23/2013   CLINICAL DATA:  Evaluate for pancreas mass  EXAM: CT ABDOMEN AND PELVIS WITHOUT AND WITH CONTRAST  TECHNIQUE: Multidetector CT imaging of the abdomen and pelvis was performed without contrast material in one or both body regions, followed by contrast material(s) and further sections in one or both body regions.  CONTRAST:  OMNIPAQUE IOHEXOL 350 MG/ML SOLN  COMPARISON:  05/14/2013  FINDINGS: The lung bases are clear.  Interval placement of stress set there is a fluid attenuating structure within the  periphery of the right hepatic lobe measuring 1 cm. The gallbladder wall stress set there is enhancement of the gallbladder wall which appears mildly prominent. Interval placement of common bile duct stent with decompression of dilated intrahepatic ducts. Pneumobilia is identified compatible with biliary patency. The pancreatic duct has a normal caliber. The uncinate process demonstrates mild hypoenhancement when compared with the pancreatic head, body and tail. The fat plane between the uncinate process in the SMA appears decreased, image 45/ series 5. No discrete mass however is identified. The spleen is normal.  The adrenal glands are both unremarkable. Normal appearance of the kidneys. The urinary bladder appears normal. The prostate gland and seminal vesicles are unremarkable.  There is a normal caliber of the abdominal aorta. No upper abdominal adenopathy identified. No pelvic or inguinal adenopathy identified.  The stomach and small bowel loops have a normal course and caliber. The proximal colon appears normal. Multiple distal colonic diverticula identified.  Review of the visualized osseous structures demonstrates no aggressive lytic or sclerotic bone lesion.  IMPRESSION: 1. There is relative hypoenhancement of the uncinate process of the pancreas when compared with the head, body and tail of pancreas. Additionally, there is loss of the fat plane between the uncinate process of the pancreas and the superior mesenteric artery, image 42/series 5. These may be indirect findings of adenocarcinoma of the pancreas. However, no discrete mass however is identified.  2. There is no upper abdominal adenopathy noted.  3. Right hepatic lobe cyst.   Electronically Signed   By: Signa Kell M.D.   On: 05/23/2013 15:38   Ct Abdomen Pelvis W Contrast  05/24/2013   CLINICAL DATA:  Abdominal pain.  EXAM: CT ABDOMEN AND PELVIS WITH CONTRAST  TECHNIQUE: Multidetector CT imaging of the abdomen and pelvis was performed  using the standard protocol following bolus administration of intravenous contrast.  CONTRAST:  50mL OMNIPAQUE IOHEXOL 300 MG/ML SOLN, OMNIPAQUE IOHEXOL 300 MG/ML SOLN  COMPARISON:  05/23/2013 a  FINDINGS: There is increasing intrahepatic and extrahepatic biliary ductal dilatation since yesterday's study. The endo biliary stent appears to have migrated distally somewhat. The tip remains in the mid to distal common bile duct then courses into the transverse duodenum. Pneumobilia. Mild distention of the gallbladder with gallbladder wall thickening.  No well-defined pancreatic mass/lesion. No pancreatic ductal dilatation. Small scattered hypodensities in the liver are stable, likely small cysts. Spleen, pancreas, adrenals and kidneys are unremarkable. Small low-density area within the midpole of the right kidney anteriorly, likely small cyst. No hydronephrosis.  Sigmoid diverticulosis. No active diverticulitis. Small bowel is decompressed. No free fluid, free air or adenopathy. Urinary bladder is unremarkable. Appendix is visualized and is normal.  Aorta and iliac vessels are calcified, non aneurysmal.  No acute bony abnormality.  IMPRESSION: Worsening intrahepatic and extrahepatic biliary ductal dilatation. Increasing distention of the gallbladder with  mild gallbladder wall thickening. The biliary stent appears to migrated slightly distally but remains in the mid to distal common bile duct and extending into the transverse duodenum.  Sigmoid diverticulosis.   Electronically Signed   By: Charlett Nose M.D.   On: 05/24/2013 20:59   Dg Chest Port 1 View  05/24/2013   CLINICAL DATA:  Abdominal pain, shortness of breath  EXAM: PORTABLE CHEST - 1 VIEW  COMPARISON:  05/22/2009  FINDINGS: Heart is normal size. Patchy left perihilar and lower lobe atelectasis or infiltrate. Right lung is clear. No visible effusion. No acute bony abnormality.  IMPRESSION: Question early infiltrate versus atelectasis in the left  perihilar and lower lobe regions.   Electronically Signed   By: Charlett Nose M.D.   On: 05/24/2013 18:42    Scheduled Meds: . enoxaparin (LOVENOX) injection  40 mg Subcutaneous Q24H  . HYDROmorphone PCA 0.3 mg/mL   Intravenous Q4H  . imipenem-cilastatin  500 mg Intravenous Q6H  . sodium chloride  3 mL Intravenous Q12H  . vancomycin  1,000 mg Intravenous Q8H   Continuous Infusions: . sodium chloride 100 mL/hr at 05/25/13 0030    Active Problems:   Pancreatic mass   Obstruction of bile duct   Sepsis   Acute cholangitis   Transaminasemia    Time spent: 35    [facility]  [facility] Pager [phone]. If 7PM-7AM, please contact night-coverage at [email or web address], password [facility] 05/25/2013, 12:11 PM  LOS: 1 day

## 2013-05-25 NOTE — Progress Notes (Signed)
ANTIBIOTIC CONSULT NOTE - INITIAL  Pharmacy Consult for Vancomycin Indication: Sepsis secondary to acute chlangitis  Allergies  Allergen Reactions  . Codeine     REACTION: nausea/vomiting  . Penicillins Rash    Patient Measurements: Height: 5\' 10"  (177.8 cm) Weight: 210 lb 11.2 oz (95.573 kg) IBW/kg (Calculated) : 73   Vital Signs: Temp: 97.7 F (36.5 C) (10/18 2346) Temp src: Oral (10/18 2346) BP: 117/64 mmHg (10/18 2346) Pulse Rate: 117 (10/18 2346) Intake/Output from previous day:   Intake/Output from this shift:    Labs:  Recent Labs  05/24/13 1935  WBC 10.0  HGB 14.5  PLT 230  CREATININE 0.90   Estimated Creatinine Clearance: 105 ml/min (by C-G formula based on Cr of 0.9). No results found for this basename: VANCOTROUGH, Leodis Binet, VANCORANDOM, GENTTROUGH, GENTPEAK, GENTRANDOM, TOBRATROUGH, TOBRAPEAK, TOBRARND, AMIKACINPEAK, AMIKACINTROU, AMIKACIN,  in the last 72 hours   Microbiology: Recent Results (from the past 720 hour(s))  URINE CULTURE     Status: None   Collection Time    05/12/13  4:37 PM      Result Value Range Status   Colony Count NO GROWTH   Final   Organism ID, Bacteria NO GROWTH   Final    Medical History: Past Medical History  Diagnosis Date  . Substance abuse     alcohol abuse, quit in 1998  . Neck pain, chronic   . Diverticulosis 08/11/2008  . Inguinal hernia   . Bile duct stricture     Medications:  Scheduled:  . enoxaparin (LOVENOX) injection  40 mg Subcutaneous Q24H  . HYDROmorphone PCA 0.3 mg/mL   Intravenous Q4H  . imipenem-cilastatin  500 mg Intravenous Q8H  . naLOXone (NARCAN)  injection  0.4 mg Intravenous Once  . sodium chloride  3 mL Intravenous Q12H  . vancomycin  1,000 mg Intravenous Q8H   Infusions:  . sodium chloride     Assessment: 57 yo c/o abdominal pain found to sepsis secondary to acute cholangitis.  Primaxin per MD and Vancomycin per Rx  Goal of Therapy:  Vancomycin trough level 15-20  mcg/ml  Plan:   Vancomycin 1Gm IV q8h.  CrCl~83 (N)  F/U Scr/levels  Susanne Greenhouse R 05/25/2013,12:18 AM

## 2013-05-25 NOTE — Consult Note (Signed)
General Surgery [facility]  Discussed patient this morning with Dr. Juanda Chance.  Clinically improved.  Planning ERCP tomorrow (Monday 10/20) with Dr. Russella Dar.  Dr. Wenda Low from our practice saw patient today and met family.  Patient and family state that they are planning to see Dr. Milana Kidney at [facility] for surgical consultation.  This has been arranged by Dr. Yancey Flemings.  Will sign off.  Call if surgical care needed.  Velora Heckler, MD, FACS General & Endocrine Surgery [facility] Office: [phone]

## 2013-05-25 NOTE — Progress Notes (Signed)
Called and notified Dr. Juanda Chance of lab results. Plan is still for ERCP in the am. Will continue to monitor patient. Erskin Burnet RN

## 2013-05-26 ENCOUNTER — Inpatient Hospital Stay (HOSPITAL_COMMUNITY): Payer: Managed Care, Other (non HMO) | Admitting: Registered Nurse

## 2013-05-26 ENCOUNTER — Ambulatory Visit: Payer: Managed Care, Other (non HMO) | Admitting: Internal Medicine

## 2013-05-26 ENCOUNTER — Encounter (HOSPITAL_COMMUNITY): Payer: Self-pay | Admitting: *Deleted

## 2013-05-26 ENCOUNTER — Inpatient Hospital Stay (HOSPITAL_COMMUNITY): Payer: Managed Care, Other (non HMO)

## 2013-05-26 ENCOUNTER — Encounter (HOSPITAL_COMMUNITY)
Admission: EM | Disposition: A | Discharge status: Home / Self Care | Payer: Self-pay | Source: Home / Self Care | Attending: Internal Medicine

## 2013-05-26 ENCOUNTER — Encounter (HOSPITAL_COMMUNITY): Payer: Managed Care, Other (non HMO) | Admitting: Registered Nurse

## 2013-05-26 DIAGNOSIS — B9689 Other specified bacterial agents as the cause of diseases classified elsewhere: Secondary | ICD-10-CM

## 2013-05-26 DIAGNOSIS — I517 Cardiomegaly: Secondary | ICD-10-CM

## 2013-05-26 DIAGNOSIS — Z113 Encounter for screening for infections with a predominantly sexual mode of transmission: Secondary | ICD-10-CM

## 2013-05-26 DIAGNOSIS — R7881 Bacteremia: Secondary | ICD-10-CM

## 2013-05-26 HISTORY — PX: ERCP: SHX5425

## 2013-05-26 LAB — BASIC METABOLIC PANEL
BUN: 17 mg/dL (ref 6–23)
CO2: 27 mEq/L (ref 19–32)
Calcium: 8.6 mg/dL (ref 8.4–10.5)
Chloride: 98 mEq/L (ref 96–112)
Creatinine, Ser: 0.8 mg/dL (ref 0.50–1.35)
GFR calc Af Amer: >90 mL/min (ref >90)
GFR calc non Af Amer: >90 mL/min (ref >90)
Glucose, Bld: 136 mg/dL — ABNORMAL HIGH (ref 70–99)
Potassium: 3.8 mEq/L (ref 3.5–5.1)
Sodium: 132 mEq/L — ABNORMAL LOW (ref 135–145)

## 2013-05-26 LAB — HEPATIC FUNCTION PANEL
ALT: 603 U/L — ABNORMAL HIGH (ref 0–53)
ALT: 562 U/L — ABNORMAL HIGH (ref 0–53)
AST: 357 U/L — ABNORMAL HIGH (ref 0–37)
AST: 326 U/L — ABNORMAL HIGH (ref 0–37)
Albumin: 2.5 g/dL — ABNORMAL LOW (ref 3.5–5.2)
Albumin: 2.4 g/dL — ABNORMAL LOW (ref 3.5–5.2)
Alkaline Phosphatase: 295 U/L — ABNORMAL HIGH (ref 39–117)
Alkaline Phosphatase: 332 U/L — ABNORMAL HIGH (ref 39–117)
Bilirubin, Direct: 7 mg/dL — ABNORMAL HIGH (ref 0.0–0.3)
Bilirubin, Direct: 6.8 mg/dL — ABNORMAL HIGH (ref 0.0–0.3)
Indirect Bilirubin: 1.3 mg/dL — ABNORMAL HIGH (ref 0.3–0.9)
Indirect Bilirubin: 1.3 mg/dL — ABNORMAL HIGH (ref 0.3–0.9)
Total Bilirubin: 8.3 mg/dL — ABNORMAL HIGH (ref 0.3–1.2)
Total Bilirubin: 8.1 mg/dL — ABNORMAL HIGH (ref 0.3–1.2)
Total Protein: 5.4 g/dL — ABNORMAL LOW (ref 6.0–8.3)
Total Protein: 5.6 g/dL — ABNORMAL LOW (ref 6.0–8.3)

## 2013-05-26 LAB — LIPASE, BLOOD: Lipase: 13 U/L (ref 11–59)

## 2013-05-26 LAB — SURGICAL PCR SCREEN
MRSA, PCR: INVALID — AB
Staphylococcus aureus: INVALID — AB

## 2013-05-26 SURGERY — ERCP, WITH INTERVENTION IF INDICATED
Anesthesia: Monitor Anesthesia Care

## 2013-05-26 SURGERY — ERCP, WITH INTERVENTION IF INDICATED
Anesthesia: General

## 2013-05-26 SURGERY — ERCP, WITH INTERVENTION IF INDICATED
Anesthesia: Moderate Sedation

## 2013-05-26 MED ORDER — SUCCINYLCHOLINE CHLORIDE 20 MG/ML IJ SOLN
INTRAMUSCULAR | Status: DC | PRN
  Administered 2013-05-26: 100 mg via INTRAVENOUS

## 2013-05-26 MED ORDER — HYDROXYZINE HCL 10 MG PO TABS
10.0000 mg | ORAL_TABLET | Freq: Three times a day (TID) | ORAL | Status: DC | PRN
  Administered 2013-05-26 – 2013-05-29 (×5): 10 mg via ORAL
  Filled 2013-05-26 (×4): qty 1

## 2013-05-26 MED ORDER — METOCLOPRAMIDE HCL 5 MG/ML IJ SOLN
10.0000 mg | Freq: Once | INTRAMUSCULAR | Status: AC
  Administered 2013-05-26: 10 mg via INTRAVENOUS
  Filled 2013-05-26: qty 2

## 2013-05-26 MED ORDER — DIPHENHYDRAMINE HCL 50 MG/ML IJ SOLN: 12.5000 mg | Freq: Once | INTRAMUSCULAR | Status: DC

## 2013-05-26 MED ORDER — FENTANYL CITRATE 0.05 MG/ML IJ SOLN
INTRAMUSCULAR | Status: DC | PRN
  Administered 2013-05-26: 50 ug via INTRAVENOUS

## 2013-05-26 MED ORDER — ONDANSETRON HCL 4 MG/2ML IJ SOLN
INTRAMUSCULAR | Status: DC | PRN
  Administered 2013-05-26: 4 mg via INTRAVENOUS

## 2013-05-26 MED ORDER — MIDAZOLAM HCL 5 MG/5ML IJ SOLN
INTRAMUSCULAR | Status: DC | PRN
  Administered 2013-05-26: 1 mg via INTRAVENOUS

## 2013-05-26 MED ORDER — LIDOCAINE HCL (CARDIAC) 20 MG/ML IV SOLN
INTRAVENOUS | Status: DC | PRN
  Administered 2013-05-26: 50 mg via INTRAVENOUS

## 2013-05-26 MED ORDER — MEPERIDINE HCL 50 MG/ML IJ SOLN: 6.2500 mg | INTRAMUSCULAR | Status: DC | PRN

## 2013-05-26 MED ORDER — SODIUM CHLORIDE 0.9 % IJ SOLN
INTRAMUSCULAR | Status: DC | PRN
  Administered 2013-05-26: 17:00:00

## 2013-05-26 MED ORDER — OXYCODONE HCL 5 MG/5ML PO SOLN
5.0000 mg | Freq: Once | ORAL | Status: DC | PRN
Start: 2013-05-26 — End: 2013-05-26
  Filled 2013-05-26: qty 5

## 2013-05-26 MED ORDER — KETOROLAC TROMETHAMINE 30 MG/ML IJ SOLN
30.0000 mg | Freq: Once | INTRAMUSCULAR | Status: AC
  Administered 2013-05-26: 30 mg via INTRAVENOUS
  Filled 2013-05-26: qty 1

## 2013-05-26 MED ORDER — LACTATED RINGERS IV SOLN
INTRAVENOUS | Status: DC | PRN
  Administered 2013-05-26: 16:00:00 via INTRAVENOUS

## 2013-05-26 MED ORDER — DIPHENHYDRAMINE HCL 50 MG/ML IJ SOLN
25.0000 mg | Freq: Once | INTRAMUSCULAR | Status: AC
  Administered 2013-05-26: 25 mg via INTRAVENOUS
  Filled 2013-05-26: qty 1

## 2013-05-26 MED ORDER — HYDROMORPHONE HCL PF 1 MG/ML IJ SOLN: 0.2500 mg | INTRAMUSCULAR | Status: DC | PRN

## 2013-05-26 MED ORDER — OXYCODONE HCL 5 MG PO TABS: 5.0000 mg | ORAL_TABLET | Freq: Once | ORAL | Status: DC | PRN

## 2013-05-26 MED ORDER — PROPOFOL 10 MG/ML IV BOLUS
INTRAVENOUS | Status: DC | PRN
  Administered 2013-05-26: 200 mg via INTRAVENOUS

## 2013-05-26 MED ORDER — PROPOFOL INFUSION 10 MG/ML OPTIME
INTRAVENOUS | Status: DC | PRN
  Administered 2013-05-26: 100 ug/kg/min via INTRAVENOUS

## 2013-05-26 MED ORDER — PROMETHAZINE HCL 25 MG/ML IJ SOLN: 6.2500 mg | INTRAMUSCULAR | Status: DC | PRN

## 2013-05-26 MED ORDER — PHENYLEPHRINE HCL 10 MG/ML IJ SOLN
INTRAMUSCULAR | Status: DC | PRN
  Administered 2013-05-26: 80 ug via INTRAVENOUS

## 2013-05-26 NOTE — Anesthesia Preprocedure Evaluation (Signed)
Anesthesia Evaluation  Patient identified by MRN, date of birth, ID band Patient awake    Reviewed: Allergy & Precautions, H&P , NPO status , Patient's Chart, lab work & pertinent test results  Airway Mallampati: II TM Distance: >3 FB Neck ROM: Full    Dental  (+) Teeth Intact and Dental Advisory Given   Pulmonary former smoker,  breath sounds clear to auscultation  Pulmonary exam normal       Cardiovascular negative cardio ROS  Rhythm:Regular Rate:Normal     Neuro/Psych negative neurological ROS  negative psych ROS   GI/Hepatic negative GI ROS, (+)     substance abuse (Patient denies alcohol use for past 15 years.)  alcohol use,   Endo/Other  negative endocrine ROS  Renal/GU negative Renal ROS     Musculoskeletal negative musculoskeletal ROS (+)   Abdominal   Peds  Hematology negative hematology ROS (+)   Anesthesia Other Findings   Reproductive/Obstetrics negative OB ROS                           Anesthesia Physical  Anesthesia Plan  ASA: II  Anesthesia Plan: MAC   Post-op Pain Management:    Induction: Intravenous  Airway Management Planned: Oral ETT  Additional Equipment:   Intra-op Plan:   Post-operative Plan: Extubation in OR  Informed Consent: I have reviewed the patients History and Physical, chart, labs and discussed the procedure including the risks, benefits and alternatives for the proposed anesthesia with the patient or authorized representative who has indicated his/her understanding and acceptance.   Dental advisory given  Plan Discussed with: CRNA  Anesthesia Plan Comments:         Anesthesia Quick Evaluation

## 2013-05-26 NOTE — Consult Note (Addendum)
[facility]    Date of Admission:  05/24/2013  Date of Consult:  05/26/2013  Reason for Consult: Gram positive bacteremia with cholangitis Referring Physician: Dr. Donna Bernard   HPI: Terry Byrd is an 57 y.o. male. who was discharged on 05/22/13 post stent placement for extrahepatic biliary obstruction with distal CBD stricture. ERCP and EUS suggested chronic pancreatitis, CBD brushings normal and FNA with atypical cells.   He was readmitted with readmitted with severe abdominal pain, cholangitis migration of the CBD stent, and now gram positive bacteremia, sepsis.   He is going for repeat ERCP this afternoon. WE were consulted due to his gram positive bacteremia.     Past Medical History  Diagnosis Date  . Substance abuse     alcohol abuse, quit in 1998  . Neck pain, chronic   . Diverticulosis 08/11/2008  . Inguinal hernia   . Bile duct stricture     Past Surgical History  Procedure Laterality Date  . Inguinal hernia repair Right     age 65  . Vasectomy    . Colonoscopy  08-11-08    per Dr. Lina Sar, normal , repeat in 5 yrs   . Tonsillectomy and adenoidectomy    . Ercp N/A 05/21/2013    Procedure: ENDOSCOPIC RETROGRADE CHOLANGIOPANCREATOGRAPHY (ERCP);  Surgeon: Iva Boop, MD;  Location: [facility];  Service: Endoscopy;  Laterality: N/A;  . Eus N/A 05/22/2013    Procedure: ESOPHAGEAL ENDOSCOPIC ULTRASOUND (EUS) RADIAL;  Surgeon: Rachael Fee, MD;  Location: [facility];  Service: Endoscopy;  Laterality: N/A;  ergies:   Allergies  Allergen Reactions  . Codeine     REACTION: nausea/vomiting  . Penicillins Rash     Medications: I have reviewed patients current medications as documented in Epic Anti-infectives   Start     Dose/Rate Route Frequency Ordered Stop   05/25/13 1200  [MAR Hold]  imipenem-cilastatin (PRIMAXIN) 500 mg in sodium chloride 0.9 % 100 mL IVPB     (On MAR Hold since 05/26/13 1539)   500 mg 200 mL/hr  over 30 Minutes Intravenous 4 times per day 05/25/13 0854     05/25/13 0030  [MAR Hold]  vancomycin (VANCOCIN) IVPB 1000 mg/200 mL premix     (On MAR Hold since 05/26/13 1539)   1,000 mg 200 mL/hr over 60 Minutes Intravenous Every 8 hours 05/25/13 0000     05/24/13 2359  imipenem-cilastatin (PRIMAXIN) 500 mg in sodium chloride 0.9 % 100 mL IVPB  Status:  Discontinued     500 mg 200 mL/hr over 30 Minutes Intravenous Every 8 hours 05/24/13 2334 05/25/13 0854   05/24/13 1930  ciprofloxacin (CIPRO) IVPB 400 mg     400 mg 200 mL/hr over 60 Minutes Intravenous  Once 05/24/13 1905 05/24/13 2137   05/24/13 1915  metroNIDAZOLE (FLAGYL) IVPB 500 mg     500 mg 100 mL/hr over 60 Minutes Intravenous  Once 05/24/13 1905 05/24/13 2254      Social History:  reports that he quit smoking about 21 months ago. His smoking use included Cigarettes. He smoked 0.00 packs per day. He has never used smokeless tobacco. He reports that he does not drink alcohol or use illicit drugs.  Family History  Problem Relation Age of Onset  . Diabetes Father   . Colon cancer Mother     As in HPI and primary teams notes otherwise 12 point review of systems is negative  Blood pressure 118/80, pulse 95, temperature  98 F (36.7 C), temperature source Oral, resp. rate 16, height 5\' 10"  (1.778 m), weight 210 lb 11.2 oz (95.573 kg), SpO2 97.00%. General: Alert and awake, oriented x3, not in any acute distress, jaundiced HEENT: icteric sclera,EOMI, oropharynx clear and without exudate CVS tachy rate, normal r,  no murmur rubs or gallops Chest: clear to auscultation bilaterally, no wheezing, rales or rhonchi Abdomen: soft TTP RUQ, nondistended, normal bowel sounds, Extremities: no  clubbing or edema noted bilaterally Skin: no rashes Neuro: nonfocal, strength and sensation intact   Results for orders placed during the hospital encounter of 05/24/13 (from the past 48 hour(s))  CBC WITH DIFFERENTIAL     Status: Abnormal    Collection Time    05/24/13  7:35 PM      Result Value Range   WBC 10.0  4.0 - 10.5 K/uL   RBC 4.81  4.22 - 5.81 MIL/uL   Hemoglobin 14.5  13.0 - 17.0 g/dL   HCT 47.8  29.5 - 62.1 %   MCV 89.4  78.0 - 100.0 fL   MCH 30.1  26.0 - 34.0 pg   MCHC 33.7  30.0 - 36.0 g/dL   RDW 30.8  65.7 - 84.6 %   Platelets 230  150 - 400 K/uL   Neutrophils Relative % 89 (*) 43 - 77 %   Neutro Abs 8.9 (*) 1.7 - 7.7 K/uL   Lymphocytes Relative 6 (*) 12 - 46 %   Lymphs Abs 0.6 (*) 0.7 - 4.0 K/uL   Monocytes Relative 4  3 - 12 %   Monocytes Absolute 0.4  0.1 - 1.0 K/uL   Eosinophils Relative 0  0 - 5 %   Eosinophils Absolute 0.0  0.0 - 0.7 K/uL   Basophils Relative 0  0 - 1 %   Basophils Absolute 0.0  0.0 - 0.1 K/uL  COMPREHENSIVE METABOLIC PANEL     Status: Abnormal   Collection Time    05/24/13  7:35 PM      Result Value Range   Sodium 135  135 - 145 mEq/L   Potassium 4.2  3.5 - 5.1 mEq/L   Chloride 101  96 - 112 mEq/L   CO2 23  19 - 32 mEq/L   Glucose, Bld 125 (*) 70 - 99 mg/dL   BUN 21  6 - 23 mg/dL   Creatinine, Ser 9.62  0.50 - 1.35 mg/dL   Calcium 8.8  8.4 - 95.2 mg/dL   Total Protein 6.8  6.0 - 8.3 g/dL   Albumin 3.4 (*) 3.5 - 5.2 g/dL   AST 841 (*) 0 - 37 U/L   ALT 656 (*) 0 - 53 U/L   Alkaline Phosphatase 405 (*) 39 - 117 U/L   Total Bilirubin 7.5 (*) 0.3 - 1.2 mg/dL   GFR calc non Af Amer >90  >90 mL/min   GFR calc Af Amer >90  >90 mL/min   Comment: (NOTE)     The eGFR has been calculated using the CKD EPI equation.     This calculation has not been validated in all clinical situations.     eGFR's persistently <90 mL/min signify possible Chronic Kidney     Disease.  LIPASE, BLOOD     Status: Abnormal   Collection Time    05/24/13  7:35 PM      Result Value Range   Lipase 66 (*) 11 - 59 U/L  LACTIC ACID, PLASMA     Status: None   Collection Time  05/24/13  7:36 PM      Result Value Range   Lactic Acid, Venous 1.6  0.5 - 2.2 mmol/L  CULTURE, BLOOD (ROUTINE X 2)     Status:  None   Collection Time    05/24/13  9:45 PM      Result Value Range   Specimen Description BLOOD LEFT ANTECUBITAL     Special Requests BOTTLES DRAWN AEROBIC AND ANAEROBIC 5CC     Culture  Setup Time       Value: 05/25/2013 01:21     Performed at [facility]   Culture       Value: GRAM POSITIVE COCCI IN CHAINS     Note: Gram Stain Report Called to,Read Back By and Verified With: JENNY BURNS 05/25/13 @ 12:40PM BY RUSCOE A.     Performed at [facility]   Report Status PENDING    CULTURE, BLOOD (ROUTINE X 2)     Status: None   Collection Time    05/24/13 10:00 PM      Result Value Range   Specimen Description BLOOD LEFT HAND     Special Requests BOTTLES DRAWN AEROBIC AND ANAEROBIC 5CC     Culture  Setup Time       Value: 05/25/2013 01:20     Performed at [facility]   Culture       Value: GRAM POSITIVE COCCI IN CHAINS     Note: Gram Stain Report Called to,Read Back By and Verified With: JENNY BURNS 05/25/13 @ 12:40PM BY RUSCOE A.     Performed at [facility]   Report Status PENDING    COMPREHENSIVE METABOLIC PANEL     Status: Abnormal   Collection Time    05/25/13  5:10 AM      Result Value Range   Sodium 135  135 - 145 mEq/L   Potassium 4.0  3.5 - 5.1 mEq/L   Chloride 101  96 - 112 mEq/L   CO2 21  19 - 32 mEq/L   Glucose, Bld 132 (*) 70 - 99 mg/dL   BUN 23  6 - 23 mg/dL   Creatinine, Ser 7.82  0.50 - 1.35 mg/dL   Calcium 9.0  8.4 - 95.6 mg/dL   Total Protein 5.8 (*) 6.0 - 8.3 g/dL   Albumin 2.8 (*) 3.5 - 5.2 g/dL   AST 213 (*) 0 - 37 U/L   ALT 652 (*) 0 - 53 U/L   Alkaline Phosphatase 326 (*) 39 - 117 U/L   Total Bilirubin 8.9 (*) 0.3 - 1.2 mg/dL   GFR calc non Af Amer 74 (*) >90 mL/min   GFR calc Af Amer 85 (*) >90 mL/min   Comment: (NOTE)     The eGFR has been calculated using the CKD EPI equation.     This calculation has not been validated in all clinical situations.     eGFR's persistently <90 mL/min signify possible Chronic  Kidney     Disease.  CBC WITH DIFFERENTIAL     Status: Abnormal   Collection Time    05/25/13  5:10 AM      Result Value Range   WBC 15.0 (*) 4.0 - 10.5 K/uL   RBC 4.40  4.22 - 5.81 MIL/uL   Hemoglobin 13.4  13.0 - 17.0 g/dL   HCT 08.6  57.8 - 46.9 %   MCV 90.7  78.0 - 100.0 fL   MCH 30.5  26.0 - 34.0 pg   MCHC  33.6  30.0 - 36.0 g/dL   RDW 21.3  08.6 - 57.8 %   Platelets 179  150 - 400 K/uL   Neutrophils Relative % 91 (*) 43 - 77 %   Lymphocytes Relative 2 (*) 12 - 46 %   Monocytes Relative 7  3 - 12 %   Eosinophils Relative 0  0 - 5 %   Basophils Relative 0  0 - 1 %   Neutro Abs 13.6 (*) 1.7 - 7.7 K/uL   Lymphs Abs 0.3 (*) 0.7 - 4.0 K/uL   Monocytes Absolute 1.1 (*) 0.1 - 1.0 K/uL   Eosinophils Absolute 0.0  0.0 - 0.7 K/uL   Basophils Absolute 0.0  0.0 - 0.1 K/uL   WBC Morphology INCREASED BANDS (>20% BANDS)    URINALYSIS W MICROSCOPIC + REFLEX CULTURE     Status: Abnormal   Collection Time    05/25/13  6:48 AM      Result Value Range   Color, Urine ORANGE (*) YELLOW   Comment: BIOCHEMICALS MAY BE AFFECTED BY COLOR   APPearance CLEAR  CLEAR   Specific Gravity, Urine 1.046 (*) 1.005 - 1.030   pH 6.0  5.0 - 8.0   Glucose, UA NEGATIVE  NEGATIVE mg/dL   Hgb urine dipstick NEGATIVE  NEGATIVE   Bilirubin Urine LARGE (*) NEGATIVE   Ketones, ur NEGATIVE  NEGATIVE mg/dL   Protein, ur 30 (*) NEGATIVE mg/dL   Urobilinogen, UA >4.6 (*) 0.0 - 1.0 mg/dL   Nitrite NEGATIVE  NEGATIVE   Leukocytes, UA TRACE (*) NEGATIVE   WBC, UA 0-2  <3 WBC/hpf   Squamous Epithelial / LPF RARE  RARE  URINE RAPID DRUG SCREEN (HOSP PERFORMED)     Status: Abnormal   Collection Time    05/25/13  6:48 AM      Result Value Range   Opiates POSITIVE (*) NONE DETECTED   Cocaine NONE DETECTED  NONE DETECTED   Benzodiazepines NONE DETECTED  NONE DETECTED   Amphetamines NONE DETECTED  NONE DETECTED   Tetrahydrocannabinol NONE DETECTED  NONE DETECTED   Barbiturates NONE DETECTED  NONE DETECTED   Comment:             DRUG SCREEN FOR MEDICAL PURPOSES     ONLY.  IF CONFIRMATION IS NEEDED     FOR ANY PURPOSE, NOTIFY LAB     WITHIN 5 DAYS.                LOWEST DETECTABLE LIMITS     FOR URINE DRUG SCREEN     Drug Class       Cutoff (ng/mL)     Amphetamine      1000     Barbiturate      200     Benzodiazepine   200     Tricyclics       300     Opiates          300     Cocaine          300     THC              50  HEPATIC FUNCTION PANEL     Status: Abnormal   Collection Time    05/25/13  2:35 PM      Result Value Range   Total Protein 6.1  6.0 - 8.3 g/dL   Albumin 3.0 (*) 3.5 - 5.2 g/dL   AST 962 (*) 0 - 37 U/L  ALT 748 (*) 0 - 53 U/L   Alkaline Phosphatase 339 (*) 39 - 117 U/L   Total Bilirubin 10.2 (*) 0.3 - 1.2 mg/dL   Bilirubin, Direct 8.3 (*) 0.0 - 0.3 mg/dL   Indirect Bilirubin 1.9 (*) 0.3 - 0.9 mg/dL  HEPATIC FUNCTION PANEL     Status: Abnormal   Collection Time    05/26/13  3:20 AM      Result Value Range   Total Protein 5.4 (*) 6.0 - 8.3 g/dL   Albumin 2.5 (*) 3.5 - 5.2 g/dL   AST 295 (*) 0 - 37 U/L   ALT 603 (*) 0 - 53 U/L   Alkaline Phosphatase 295 (*) 39 - 117 U/L   Total Bilirubin 8.3 (*) 0.3 - 1.2 mg/dL   Bilirubin, Direct 7.0 (*) 0.0 - 0.3 mg/dL   Indirect Bilirubin 1.3 (*) 0.3 - 0.9 mg/dL  AMYLASE     Status: None   Collection Time    05/26/13  3:20 AM      Result Value Range   Amylase 36  0 - 105 U/L  LIPASE, BLOOD     Status: None   Collection Time    05/26/13  3:20 AM      Result Value Range   Lipase 13  11 - 59 U/L  BASIC METABOLIC PANEL     Status: Abnormal   Collection Time    05/26/13  3:20 AM      Result Value Range   Sodium 132 (*) 135 - 145 mEq/L   Potassium 3.8  3.5 - 5.1 mEq/L   Chloride 98  96 - 112 mEq/L   CO2 27  19 - 32 mEq/L   Glucose, Bld 136 (*) 70 - 99 mg/dL   BUN 17  6 - 23 mg/dL   Creatinine, Ser 6.21  0.50 - 1.35 mg/dL   Calcium 8.6  8.4 - 30.8 mg/dL   GFR calc non Af Amer >90  >90 mL/min   GFR calc Af Amer >90  >90 mL/min     Comment: (NOTE)     The eGFR has been calculated using the CKD EPI equation.     This calculation has not been validated in all clinical situations.     eGFR's persistently <90 mL/min signify possible Chronic Kidney     Disease.  HEPATIC FUNCTION PANEL     Status: Abnormal   Collection Time    05/26/13  2:09 PM      Result Value Range   Total Protein 5.6 (*) 6.0 - 8.3 g/dL   Albumin 2.4 (*) 3.5 - 5.2 g/dL   AST 657 (*) 0 - 37 U/L   ALT 562 (*) 0 - 53 U/L   Alkaline Phosphatase 332 (*) 39 - 117 U/L   Total Bilirubin 8.1 (*) 0.3 - 1.2 mg/dL   Bilirubin, Direct 6.8 (*) 0.0 - 0.3 mg/dL   Indirect Bilirubin 1.3 (*) 0.3 - 0.9 mg/dL      Component Value Date/Time   SDES BLOOD LEFT HAND 05/24/2013 2200   SPECREQUEST BOTTLES DRAWN AEROBIC AND ANAEROBIC 5CC 05/24/2013 2200   CULT  Value: GRAM POSITIVE COCCI IN CHAINS Note: Gram Stain Report Called to,Read Back By and Verified With: JENNY BURNS 05/25/13 @ 12:40PM BY RUSCOE A. Performed at [facility] 05/24/2013 2200   REPTSTATUS PENDING 05/24/2013 2200   Dg Chest 2 View  05/25/2013   CLINICAL DATA:  Increase in chest pain and shortness of Breath.  EXAM: CHEST  2 VIEW  COMPARISON:  05/24/2013  FINDINGS: On the lateral view, there is relative increased opacity posteriorly near the lung base. This may reflect a small left medial lower lobe infiltrate. It may be atelectasis accentuated by respiratory motion.  The lungs are otherwise clear. No pleural effusion or pneumothorax.  The heart, mediastinum and hila are unremarkable.  IMPRESSION: 1. Left medial lung base opacity. This could reflect infiltrate or atelectasis. It is best visualized on the lateral view. 2. No other abnormalities.   Electronically Signed   By: Amie Portland M.D.   On: 05/25/2013 12:32   Ct Abdomen Pelvis W Contrast  05/24/2013   CLINICAL DATA:  Abdominal pain.  EXAM: CT ABDOMEN AND PELVIS WITH CONTRAST  TECHNIQUE: Multidetector CT imaging of the abdomen and pelvis  was performed using the standard protocol following bolus administration of intravenous contrast.  CONTRAST:  50mL OMNIPAQUE IOHEXOL 300 MG/ML SOLN, OMNIPAQUE IOHEXOL 300 MG/ML SOLN  COMPARISON:  05/23/2013 a  FINDINGS: There is increasing intrahepatic and extrahepatic biliary ductal dilatation since yesterday's study. The endo biliary stent appears to have migrated distally somewhat. The tip remains in the mid to distal common bile duct then courses into the transverse duodenum. Pneumobilia. Mild distention of the gallbladder with gallbladder wall thickening.  No well-defined pancreatic mass/lesion. No pancreatic ductal dilatation. Small scattered hypodensities in the liver are stable, likely small cysts. Spleen, pancreas, adrenals and kidneys are unremarkable. Small low-density area within the midpole of the right kidney anteriorly, likely small cyst. No hydronephrosis.  Sigmoid diverticulosis. No active diverticulitis. Small bowel is decompressed. No free fluid, free air or adenopathy. Urinary bladder is unremarkable. Appendix is visualized and is normal.  Aorta and iliac vessels are calcified, non aneurysmal.  No acute bony abnormality.  IMPRESSION: Worsening intrahepatic and extrahepatic biliary ductal dilatation. Increasing distention of the gallbladder with mild gallbladder wall thickening. The biliary stent appears to migrated slightly distally but remains in the mid to distal common bile duct and extending into the transverse duodenum.  Sigmoid diverticulosis.   Electronically Signed   By: Charlett Nose M.D.   On: 05/24/2013 20:59   Dg Chest Port 1 View  05/24/2013   CLINICAL DATA:  Abdominal pain, shortness of breath  EXAM: PORTABLE CHEST - 1 VIEW  COMPARISON:  05/22/2009  FINDINGS: Heart is normal size. Patchy left perihilar and lower lobe atelectasis or infiltrate. Right lung is clear. No visible effusion. No acute bony abnormality.  IMPRESSION: Question early infiltrate versus atelectasis in  the left perihilar and lower lobe regions.   Electronically Signed   By: Charlett Nose M.D.   On: 05/24/2013 18:42     Recent Results (from the past 720 hour(s))  URINE CULTURE     Status: None   Collection Time    05/12/13  4:37 PM      Result Value Range Status   Colony Count NO GROWTH   Final   Organism ID, Bacteria NO GROWTH   Final  CULTURE, BLOOD (ROUTINE X 2)     Status: None   Collection Time    05/24/13  9:45 PM      Result Value Range Status   Specimen Description BLOOD LEFT ANTECUBITAL   Final   Special Requests BOTTLES DRAWN AEROBIC AND ANAEROBIC 5CC   Final   Culture  Setup Time     Final   Value: 05/25/2013 01:21     Performed at [facility]   Culture     Final  Value: GRAM POSITIVE COCCI IN CHAINS     Note: Gram Stain Report Called to,Read Back By and Verified With: JENNY BURNS 05/25/13 @ 12:40PM BY RUSCOE A.     Performed at [facility]   Report Status PENDING   Incomplete  CULTURE, BLOOD (ROUTINE X 2)     Status: None   Collection Time    05/24/13 10:00 PM      Result Value Range Status   Specimen Description BLOOD LEFT HAND   Final   Special Requests BOTTLES DRAWN AEROBIC AND ANAEROBIC 5CC   Final   Culture  Setup Time     Final   Value: 05/25/2013 01:20     Performed at [facility]   Culture     Final   Value: GRAM POSITIVE COCCI IN CHAINS     Note: Gram Stain Report Called to,Read Back By and Verified With: JENNY BURNS 05/25/13 @ 12:40PM BY RUSCOE A.     Performed at [facility]   Report Status PENDING   Incomplete     Impression/Recommendation  57 year old with CBD stricture, sp stent placement with migration of stent worsening extrahepatic biliary obstruction, cholangitis with GP bacteremia  #1 Gram positive bacteremia : undoubtedly due to his cholangitis. I suspect we are dealing with Enterococcus. I am hoping AMP sensitive variety.   If he indeed has Enterococcal bacteremia we will need to get TEE to make sure  he does not have endocarditis and based on chronicitiy of much of his symptoms I would put it in the diffrential. Furthermore he would then need TWO drugs including an aminoglycoside  For now continue the vancomycin, and imipenem  --REPEAT BLOOD CULTURES  --DO NOT PLACE PICC UNTIL WE HAVE PROVED CLEARNANCE OF HIS BACTEREMIA  WOuld like to clarify if he has ever had pCN derivative since his rash as an 57 year old  I spent greater than 60 minutes with the patient including greater than 50% of time in face to face counsel of the patient and in coordination of their care.    #2 Cholangitis: broadly covered, to have ERCP  #3 Chronic pancreatitis based on biopsy: had a few atypical cells on biopsy. Being worked up by GI.  #4 Screening: check HIV, hepatitis panel  Thank you so much for this interesting consult  [facility] [phone] (pager) [phone] (office) 05/26/2013, 5:52 PM  Paulette Blanch Dam 05/26/2013, 5:52 PM

## 2013-05-26 NOTE — Op Note (Signed)
[facility] [address] [place], [address]   ERCP PROCEDURE REPORT  PATIENT: Terry Byrd, Terry Byrd.  MR# :[id number] BIRTHDATE: 1956-06-17  GENDER: Male ENDOSCOPIST: Meryl Dare, MD, [facility] REFERRED BY: [facility] PROCEDURE DATE:  05/26/2013 PROCEDURE:   ERCP with stent placement ASA CLASS:   Class III INDICATIONS:established ascending cholangitis.  bile duct stricture.  MEDICATIONS: General endotracheal anesthesia (GETA) TOPICAL ANESTHETIC: none  DESCRIPTION OF PROCEDURE:   After the risks benefits and alternatives of the procedure were thoroughly explained, informed consent was obtained.  The     endoscope was introduced through the mouth  and advanced to the second portion of the duodenum .  1.  A tight 3 cm stricture was noted in the distal common bile duct. The indwelling CBD stent was 3 cm out of the ampulla. 2.  There was dilation of the proximal common bile duct. After cannulation pus was draining from the ampulla. 3.  Under endoscopic and fluoroscopic guidance the in dwelling biliary stent was reomved and an 8.5 Fr x 7 cm double flap stent was placed in the common bile duct in very good position across the stricture and draining well.. The scope was then completely withdrawn from the patient and the procedure terminated.     COMPLICATIONS: .  There were no complications.  ENDOSCOPIC IMPRESSION: 1.   Stricture in the distal common bile duct 2.   Dilation of the proximal common bile duct 3.   Cholangitis 4.   Bilary stent exchange performed  RECOMMENDATIONS: 1.  liver enzymes 2.  continue antibiotics  eSigned:  Meryl Dare, MD, Clementeen Graham 05/26/2013 6:02 PM   UY:QIHK Marina Goodell, MD

## 2013-05-26 NOTE — Progress Notes (Signed)
Echocardiogram 2D Echocardiogram has been performed.  Terry Byrd 05/26/2013, 12:54 PM

## 2013-05-26 NOTE — OR Nursing (Signed)
Pt positioned prone with prone rolls under chest, gel pads to knees, pillows under feet and ankles, foam pads under each axilla, safety straps to upper thigh, calf's and arms. Tomma Rakers, RN

## 2013-05-26 NOTE — Progress Notes (Signed)
INITIAL NUTRITION ASSESSMENT  DOCUMENTATION CODES Per approved criteria  -Obesity Unspecified   INTERVENTION: Diet advancement per MD discretion RD to monitor nutrition care plan and provide interventions as needed  NUTRITION DIAGNOSIS: Inadequate oral intake related to abdominal pain and frequent hospitalizations as evidenced by >5% wt loss in less than one month and current NPO status.   Goal: Pt to meet >/= 90% of their estimated nutrition needs   Monitor:  Diet advancement PO intake Weight Labs  Reason for Assessment: Malnutrition Screening Tool, score of 3  57 y.o. male  Admitting Dx: <principal problem not specified>  ASSESSMENT: 57 year old male presents to the emergency department with a one-day history of worsening abdominal pain in the epigastric and periumbilical region. The patient was recently discharged from the hospital on 05/22/2013 after admission for workup of abdominal pain. Abdominal ultrasound on 05/21/2013 revealed dilated biliary tree with gallbladder sludge. The patient's hepatic enzymes and bilirubin were elevated at that time. The patient underwent ERCP on 05/21/2013 during which a stent was placed in the common bile duct. Endoscopic ultrasound was performed on 05/22/2013. During the ultrasound, the patient's pancreatic parenchyma and ductal parenchyma suggested chronic pancreatitis. The patient was discharged home with Norco on 05/22/13. On the morning of this admission, the patient has significant increase of his abdominal pain. He denied any fevers or chills at home, but the patient had a temperature of 102.33F in the ED.  Pt in progress of meeting in room at time of first visit and being brought down for procedure at time of second visit. At previous admission pt reported 15 lbs wt loss in 3 weeks due to abdominal pain. Pt's weight dropped from 220 lbs to 206 lbs in less than 3 weeks per weight history. Pt was NPO during previous admission and has been NPO  or on clear liquids for the past 3 days.   Height: Ht Readings from Last 1 Encounters:  05/24/13 5\' 10"  (1.778 m)    Weight: Wt Readings from Last 1 Encounters:  05/24/13 210 lb 11.2 oz (95.573 kg)    Ideal Body Weight: 166 lbs  % Ideal Body Weight: 126%  Wt Readings from Last 10 Encounters:  05/24/13 210 lb 11.2 oz (95.573 kg)  05/24/13 210 lb 11.2 oz (95.573 kg)  05/24/13 210 lb 11.2 oz (95.573 kg)  05/24/13 210 lb 11.2 oz (95.573 kg)  05/21/13 206 lb 11.2 oz (93.759 kg)  05/21/13 206 lb 11.2 oz (93.759 kg)  05/21/13 206 lb 11.2 oz (93.759 kg)  05/19/13 212 lb 6 oz (96.333 kg)  05/12/13 215 lb (97.523 kg)  05/02/13 220 lb (99.791 kg)    Usual Body Weight: 220 lbs (05/02/13)  % Usual Body Weight: 95%  BMI:  Body mass index is 30.23 kg/(m^2).  Estimated Nutritional Needs: Kcal: 2000-2200 Protein: 90-110 grams Fluid: 2.6 L/day  Skin: jaundice, dry, itching  Diet Order: NPO  EDUCATION NEEDS: -No education needs identified at this time   Intake/Output Summary (Last 24 hours) at 05/26/13 1537 Last data filed at 05/26/13 1426  Gross per 24 hour  Intake 2851.57 ml  Output   1525 ml  Net 1326.57 ml    Last BM: PTA   Labs:   Recent Labs Lab 05/24/13 1935 05/25/13 0510 05/26/13 0320  NA 135 135 132*  K 4.2 4.0 3.8  CL 101 101 98  CO2 23 21 27   BUN 21 23 17   CREATININE 0.90 1.09 0.80  CALCIUM 8.8 9.0 8.6  GLUCOSE 125* 132*  136*    CBG (last 3)  No results found for this basename: GLUCAP,  in the last 72 hours  Scheduled Meds: . diphenhydrAMINE  12.5 mg Intravenous Once  . enoxaparin (LOVENOX) injection  40 mg Subcutaneous Q24H  . HYDROmorphone PCA 0.3 mg/mL   Intravenous Q4H  . imipenem-cilastatin  500 mg Intravenous Q6H  . sodium chloride  3 mL Intravenous Q12H  . vancomycin  1,000 mg Intravenous Q8H    Continuous Infusions: . sodium chloride 100 mL/hr at 05/25/13 2328    Past Medical History  Diagnosis Date  . Substance abuse      alcohol abuse, quit in 1998  . Neck pain, chronic   . Diverticulosis 08/11/2008  . Inguinal hernia   . Bile duct stricture     Past Surgical History  Procedure Laterality Date  . Inguinal hernia repair Right     age 41  . Vasectomy    . Colonoscopy  08-11-08    per Dr. Lina Sar, normal , repeat in 5 yrs   . Tonsillectomy and adenoidectomy    . Ercp N/A 05/21/2013    Procedure: ENDOSCOPIC RETROGRADE CHOLANGIOPANCREATOGRAPHY (ERCP);  Surgeon: Iva Boop, MD;  Location: [facility];  Service: Endoscopy;  Laterality: N/A;  . Eus N/A 05/22/2013    Procedure: ESOPHAGEAL ENDOSCOPIC ULTRASOUND (EUS) RADIAL;  Surgeon: Rachael Fee, MD;  Location: [facility];  Service: Endoscopy;  Laterality: N/A;    Ian Malkin RD, LDN Inpatient Clinical Dietitian Pager: [phone] After Hours Pager: [phone]

## 2013-05-26 NOTE — Transfer of Care (Signed)
Immediate Anesthesia Transfer of Care Note  Patient: Terry Byrd  Procedure(s) Performed: Procedure(s): ENDOSCOPIC RETROGRADE CHOLANGIOPANCREATOGRAPHY (ERCP) (N/A)  Patient Location: PACU  Anesthesia Type:General  Level of Consciousness: awake, alert , oriented and patient cooperative  Airway & Oxygen Therapy: Patient Spontanous Breathing and Patient connected to face mask oxygen  Post-op Assessment: Report given to PACU RN, Post -op Vital signs reviewed and stable and Patient moving all extremities X 4  Post vital signs: stable  Complications: No apparent anesthesia complications

## 2013-05-26 NOTE — Interval H&P Note (Signed)
History and Physical Interval Note:  05/26/2013 3:09 PM  Terry Byrd  has presented today for surgery, with the diagnosis of stone  The various methods of treatment have been discussed with the patient and family. After consideration of risks, benefits and other options for treatment, the patient has consented to  Procedure(s): ENDOSCOPIC RETROGRADE CHOLANGIOPANCREATOGRAPHY (ERCP) (N/A) as a surgical intervention .  The patient's history has been reviewed, patient examined, no change in status, stable for surgery.  I have reviewed the patient's chart and labs.  Questions were answered to the patient's satisfaction.     Venita Lick. Russella Dar MD Clementeen Graham

## 2013-05-26 NOTE — Progress Notes (Signed)
[facility] PROGRESS NOTE  Terry Byrd XBJ:[id number] DOB: [birth year] DOA: 05/24/2013 PCP: [name], MD  Assessment/Plan: GPC Sepsis  -cultures with GPC await ID and sens, continue current vanc and imipenem -Secondary to acute cholangitis  -continue IVF, BP stable  -await echo to eval for vegetations, I have consulted ID Acute cholangitis  -Secondary to biliary stent migration  -Blood cultures x2 sets--( noted that blood cultures were obtained after the patient was given Cipro and Flagyl) +GPC as above -continue vancomycin and imipenem pending culture ID and sens -UA neg -appreciate GI assistance-plan for repeat ERCP/stent today, but due to increase in pain  T.bili worsened yesterday but trending back down today -appreciate general surgery-per Dr. Gerrit Friends family states they are to see surgeon at [facility] Uncontrolled pain  -Secondary to above -continue Dilaudid PCA pump, better control today-  follow -Valium 5 mg every 8 hours when necessary muscle spasm  Elevated LFTs -likely due to biliary stent migration  Probable HCAP   -continue empiric vanc/imipenem as above and follow      Code Status: FULL Family Communication: Wife at bedside  Disposition Plan: to home when medically ready   Consultants:  GI  Procedures:  none  Antibiotics:  vanc and imipenem started 10/18  HPI/Subjective: States abd pain about 2-3/10, c/o pruritis  Objective: Filed Vitals:   05/26/13 0757  BP:   Pulse:   Temp:   Resp: 13    Intake/Output Summary (Last 24 hours) at 05/26/13 1002 Last data filed at 05/26/13 0619  Gross per 24 hour  Intake 3091.57 ml  Output   2025 ml  Net 1066.57 ml   Filed Weights   05/24/13 2346  Weight: 95.573 kg (210 lb 11.2 oz)   Exam:  General: alert & oriented x 3 In NAD Cardiovascular: RRR, nl S1 s2 Respiratory: CTAB Abdomen: soft +BS NT/ND, no masses palpable Extremities: No cyanosis and no edema    Data Reviewed: Basic  Metabolic Panel:  Recent Labs Lab 05/19/13 1525 05/21/13 1225 05/24/13 1935 05/25/13 0510 05/26/13 0320  NA 139 134* 135 135 132*  K 3.9 3.9 4.2 4.0 3.8  CL 101 102 101 101 98  CO2 30 25 23 21 27   GLUCOSE 94 102* 125* 132* 136*  BUN 19 17 21 23 17   CREATININE 0.9 0.95 0.90 1.09 0.80  CALCIUM 9.2 8.9 8.8 9.0 8.6   Liver Function Tests:  Recent Labs Lab 05/21/13 1225 05/24/13 1935 05/25/13 0510 05/25/13 1435 05/26/13 0320  AST 228* 451* 436* 464* 357*  ALT 594* 656* 652* 748* 603*  ALKPHOS 347* 405* 326* 339* 295*  BILITOT 9.0* 7.5* 8.9* 10.2* 8.3*  PROT 6.6 6.8 5.8* 6.1 5.4*  ALBUMIN 3.4* 3.4* 2.8* 3.0* 2.5*    Recent Labs Lab 05/19/13 1525 05/24/13 1935 05/26/13 0320  LIPASE 51.0 66* 13  AMYLASE  --   --  36   No results found for this basename: AMMONIA,  in the last 168 hours CBC:  Recent Labs Lab 05/19/13 1525 05/24/13 1935 05/25/13 0510  WBC 6.3 10.0 15.0*  NEUTROABS 3.9 8.9* 13.6*  HGB 14.4 14.5 13.4  HCT 42.3 43.0 39.9  MCV 87.8 89.4 90.7  PLT 259.0 230 179   Cardiac Enzymes: No results found for this basename: CKTOTAL, CKMB, CKMBINDEX, TROPONINI,  in the last 168 hours BNP (last 3 results) No results found for this basename: PROBNP,  in the last 8760 hours CBG: No results found for this basename: GLUCAP,  in the last 168  hours  Recent Results (from the past 240 hour(s))  CULTURE, BLOOD (ROUTINE X 2)     Status: None   Collection Time    05/24/13  9:45 PM      Result Value Range Status   Specimen Description BLOOD LEFT ANTECUBITAL   Final   Special Requests BOTTLES DRAWN AEROBIC AND ANAEROBIC 5CC   Final   Culture  Setup Time     Final   Value: 05/25/2013 01:21     Performed at [facility]   Culture     Final   Value: GRAM POSITIVE COCCI IN CHAINS     Note: Gram Stain Report Called to,Read Back By and Verified With: JENNY BURNS 05/25/13 @ 12:40PM BY RUSCOE A.     Performed at [facility]   Report Status PENDING    Incomplete  CULTURE, BLOOD (ROUTINE X 2)     Status: None   Collection Time    05/24/13 10:00 PM      Result Value Range Status   Specimen Description BLOOD LEFT HAND   Final   Special Requests BOTTLES DRAWN AEROBIC AND ANAEROBIC 5CC   Final   Culture  Setup Time     Final   Value: 05/25/2013 01:20     Performed at [facility]   Culture     Final   Value: GRAM POSITIVE COCCI IN CHAINS     Note: Gram Stain Report Called to,Read Back By and Verified With: JENNY BURNS 05/25/13 @ 12:40PM BY RUSCOE A.     Performed at [facility]   Report Status PENDING   Incomplete     Studies: Dg Chest 2 View  05/25/2013   CLINICAL DATA:  Increase in chest pain and shortness of Breath.  EXAM: CHEST  2 VIEW  COMPARISON:  05/24/2013  FINDINGS: On the lateral view, there is relative increased opacity posteriorly near the lung base. This may reflect a small left medial lower lobe infiltrate. It may be atelectasis accentuated by respiratory motion.  The lungs are otherwise clear. No pleural effusion or pneumothorax.  The heart, mediastinum and hila are unremarkable.  IMPRESSION: 1. Left medial lung base opacity. This could reflect infiltrate or atelectasis. It is best visualized on the lateral view. 2. No other abnormalities.   Electronically Signed   By: Amie Portland M.D.   On: 05/25/2013 12:32   Ct Abdomen Pelvis W Contrast  05/24/2013   CLINICAL DATA:  Abdominal pain.  EXAM: CT ABDOMEN AND PELVIS WITH CONTRAST  TECHNIQUE: Multidetector CT imaging of the abdomen and pelvis was performed using the standard protocol following bolus administration of intravenous contrast.  CONTRAST:  50mL OMNIPAQUE IOHEXOL 300 MG/ML SOLN, OMNIPAQUE IOHEXOL 300 MG/ML SOLN  COMPARISON:  05/23/2013 a  FINDINGS: There is increasing intrahepatic and extrahepatic biliary ductal dilatation since yesterday's study. The endo biliary stent appears to have migrated distally somewhat. The tip remains in the mid to distal  common bile duct then courses into the transverse duodenum. Pneumobilia. Mild distention of the gallbladder with gallbladder wall thickening.  No well-defined pancreatic mass/lesion. No pancreatic ductal dilatation. Small scattered hypodensities in the liver are stable, likely small cysts. Spleen, pancreas, adrenals and kidneys are unremarkable. Small low-density area within the midpole of the right kidney anteriorly, likely small cyst. No hydronephrosis.  Sigmoid diverticulosis. No active diverticulitis. Small bowel is decompressed. No free fluid, free air or adenopathy. Urinary bladder is unremarkable. Appendix is visualized and is normal.  Aorta  and iliac vessels are calcified, non aneurysmal.  No acute bony abnormality.  IMPRESSION: Worsening intrahepatic and extrahepatic biliary ductal dilatation. Increasing distention of the gallbladder with mild gallbladder wall thickening. The biliary stent appears to migrated slightly distally but remains in the mid to distal common bile duct and extending into the transverse duodenum.  Sigmoid diverticulosis.   Electronically Signed   By: Charlett Nose M.D.   On: 05/24/2013 20:59   Dg Chest Port 1 View  05/24/2013   CLINICAL DATA:  Abdominal pain, shortness of breath  EXAM: PORTABLE CHEST - 1 VIEW  COMPARISON:  05/22/2009  FINDINGS: Heart is normal size. Patchy left perihilar and lower lobe atelectasis or infiltrate. Right lung is clear. No visible effusion. No acute bony abnormality.  IMPRESSION: Question early infiltrate versus atelectasis in the left perihilar and lower lobe regions.   Electronically Signed   By: Charlett Nose M.D.   On: 05/24/2013 18:42    Scheduled Meds: . enoxaparin (LOVENOX) injection  40 mg Subcutaneous Q24H  . HYDROmorphone PCA 0.3 mg/mL   Intravenous Q4H  . imipenem-cilastatin  500 mg Intravenous Q6H  . sodium chloride  3 mL Intravenous Q12H  . vancomycin  1,000 mg Intravenous Q8H   Continuous Infusions: . sodium chloride 100 mL/hr  at 05/25/13 2328    Active Problems:   Pancreatic mass   Obstruction of bile duct   Sepsis   Acute cholangitis   Transaminasemia    Time spent: 35    [facility]  [facility] Pager [phone]. If 7PM-7AM, please contact night-coverage at [email or web address], password [facility] 05/26/2013, 10:02 AM  LOS: 2 days

## 2013-05-26 NOTE — H&P (View-Only) (Signed)
[facility] Consultation  Referring Provider: none Primary Care Physician:  FRY,STEPHEN A, MD Primary Gastroenterologist:  Dr.John Perry  Reason for Consultation:  Severe abdominal pain x several hours, vomting  HPI:    Terry Byrd is a 57 y.o. male who was discharged 3 days ago after evaluation for extrahapatic biliary obstruction, which was attributed to a distal CBC stricture. CT scan of tha abdomen as well as ERCP with placement of a biliary stent and subsequent EUS by Dr Jacobs suggested  Chronic  pancreatitis. Cytologies from the CBD brushings were negative, FNA showed atypical cells. Ca19-9. CT scan of the abdomen c/w fatty liver. Pt was supposed to have follow up LFT's next week and appointm with Dr Perry but never became pain free, his jaundice did not subside and he became very sick with chills later this afternoon. Wife called Dr Perry and myself from ER to see him immediately. No BM's.Positive F hx of colon cancer in a parent- colon 4 years ago.   Past Medical History  Diagnosis Date  . Substance abuse     alcohol abuse, quit in 1998  . Neck pain, chronic   . Diverticulosis 08/11/2008  . Inguinal hernia   . Bile duct stricture     Past Surgical History  Procedure Laterality Date  . Inguinal hernia repair Right     age 8  . Vasectomy    . Colonoscopy  08-11-08    per Dr. Ilyse Tremain, normal , repeat in 5 yrs   . Tonsillectomy and adenoidectomy    . Ercp N/A 05/21/2013    Procedure: ENDOSCOPIC RETROGRADE CHOLANGIOPANCREATOGRAPHY (ERCP);  Surgeon: Carl E Gessner, MD;  Location: [facility];  Service: Endoscopy;  Laterality: N/A;  . Eus N/A 05/22/2013    Procedure: ESOPHAGEAL ENDOSCOPIC ULTRASOUND (EUS) RADIAL;  Surgeon: Daniel P Jacobs, MD;  Location: [facility];  Service: Endoscopy;  Laterality: N/A;    Prior to Admission medications   Medication Sig Start Date End Date Taking? Authorizing Provider  acetaminophen (TYLENOL) 500 MG tablet Take 500 mg  by mouth every 6 (six) hours as needed for pain.   Yes Historical Provider, MD  calcium carbonate (TUMS - DOSED IN MG ELEMENTAL CALCIUM) 500 MG chewable tablet Chew 1 tablet by mouth daily as needed for heartburn.    Yes Historical Provider, MD  cyclobenzaprine (FLEXERIL) 10 MG tablet Take 10 mg by mouth 3 (three) times daily as needed for muscle spasms.   Yes Historical Provider, MD  HYDROcodone-acetaminophen (NORCO/VICODIN) 5-325 MG per tablet Take 2 tablets by mouth every 6 (six) hours as needed for pain. 05/22/13  Yes Paula M Guenther, NP  LORazepam (ATIVAN) 0.5 MG tablet Take 0.5 mg by mouth every 8 (eight) hours. 05/22/13  Yes Paula M Guenther, NP  ondansetron (ZOFRAN) 4 MG tablet Take 1 tablet (4 mg total) by mouth every 6 (six) hours as needed for nausea. 05/22/13  Yes Paula M Guenther, NP    Current Facility-Administered Medications  Medication Dose Route Frequency Provider Last Rate Last Dose  . metroNIDAZOLE (FLAGYL) IVPB 500 mg  500 mg Intravenous Once John David Wofford, MD       Current Outpatient Prescriptions  Medication Sig Dispense Refill  . acetaminophen (TYLENOL) 500 MG tablet Take 500 mg by mouth every 6 (six) hours as needed for pain.      . calcium carbonate (TUMS - DOSED IN MG ELEMENTAL CALCIUM) 500 MG chewable tablet Chew 1 tablet by mouth daily as needed for heartburn.       .   cyclobenzaprine (FLEXERIL) 10 MG tablet Take 10 mg by mouth 3 (three) times daily as needed for muscle spasms.      . HYDROcodone-acetaminophen (NORCO/VICODIN) 5-325 MG per tablet Take 2 tablets by mouth every 6 (six) hours as needed for pain.  30 tablet  0  . LORazepam (ATIVAN) 0.5 MG tablet Take 0.5 mg by mouth every 8 (eight) hours.      . ondansetron (ZOFRAN) 4 MG tablet Take 1 tablet (4 mg total) by mouth every 6 (six) hours as needed for nausea.  30 tablet  0    Allergies as of 05/24/2013 - Review Complete 05/24/2013  Allergen Reaction Noted  . Codeine  03/26/2007  . Penicillins Rash  03/26/2007    Family History  Problem Relation Age of Onset  . Diabetes Father   . Colon cancer Mother     History   Social History  . Marital Status: Married    Spouse Name: N/A    Number of Children: 2  . Years of Education: N/A   Occupational History  . sales rep    Social History Main Topics  . Smoking status: Former Smoker    Types: Cigarettes    Quit date: 08/08/2011  . Smokeless tobacco: Never Used  . Alcohol Use: No     Comment: quit 1998  . Drug Use: No  . Sexual Activity: No   Other Topics Concern  . Not on file   Social History Narrative  . No narrative on file    Review of Systems: All other systems reviewed and negative except where noted in HPI.    Physical Exam:   Vital signs in last 24 hours: Temp:  [98.5 F (36.9 C)] 98.5 F (36.9 C) (10/18 1727) Pulse Rate:  [94-118] 118 (10/18 1924) Resp:  [18-19] 19 (10/18 1924) BP: (134-156)/(88-94) 156/94 mmHg (10/18 1924) SpO2:  [88 %-97 %] 88 % (10/18 1924)   General:   Alert,  Well-developed, well-nourished, acutely distressed  Head:  Normocephalic and atraumatic. Eyes:  Sclera icteric.   Conjunctiva pink. Ears:  Normal auditory acuity. Nose:  No deformity, discharge,  or lesions. Mouth:  No deformity or lesions.  Oropharynx pink & moist. Neck:  Supple; no masses or thyromegaly. Lungs:  Clear throughout to auscultation.   No wheezes, crackles, or rhonchi. No acute distress. Heart:  Regular rate and rhythm; no murmurs, clicks, rubs,  or gallops. Abdomen:  Distended, tense, quiet bowl sounds, no fluid wave hepatosplenomegaly or hernias noted. Normal bowel sounds, without guarding, and without rebound.   Rectal: not done   Msk:  Symmetrical without gross deformities. Pulses:  Normal pulses noted. Extremities:  Without clubbing or edema. Neurologic:  Alert and  oriented x4;  grossly normal neurologically. Skin:  Intact without significant lesions or rashes. Cervical Nodes:  No significant  cervical adenopathy. Psych:  Alert and cooperative.in severe pain.  Intake/Output from previous day:   Intake/Output this shift:    Lab Results:  Recent Labs  05/24/13 1935  WBC 10.0  HGB 14.5  HCT 43.0  PLT 230   BMET  Recent Labs  05/24/13 1935  NA 135  K 4.2  CL 101  CO2 23  GLUCOSE 125*  BUN 21  CREATININE 0.90  CALCIUM 8.8   LFT  Recent Labs  05/24/13 1935  PROT 6.8  ALBUMIN 3.4*  AST 451*  ALT 656*  ALKPHOS 405*  BILITOT 7.5*   PT/INR No results found for this basename: LABPROT, INR,  in the   last 72 hours Hepatitis Panel No results found for this basename: HEPBSAG, HCVAB, HEPAIGM, HEPBIGM,  in the last 72 hours  Studies/Results: Ct Abdomen Pelvis W Wo Contrast  05/23/2013   CLINICAL DATA:  Evaluate for pancreas mass  EXAM: CT ABDOMEN AND PELVIS WITHOUT AND WITH CONTRAST  TECHNIQUE: Multidetector CT imaging of the abdomen and pelvis was performed without contrast material in one or both body regions, followed by contrast material(s) and further sections in one or both body regions.  CONTRAST:  100mL OMNIPAQUE IOHEXOL 350 MG/ML SOLN  COMPARISON:  05/14/2013  FINDINGS: The lung bases are clear.  Interval placement of stress set there is a fluid attenuating structure within the periphery of the right hepatic lobe measuring 1 cm. The gallbladder wall stress set there is enhancement of the gallbladder wall which appears mildly prominent. Interval placement of common bile duct stent with decompression of dilated intrahepatic ducts. Pneumobilia is identified compatible with biliary patency. The pancreatic duct has a normal caliber. The uncinate process demonstrates mild hypoenhancement when compared with the pancreatic head, body and tail. The fat plane between the uncinate process in the SMA appears decreased, image 45/ series 5. No discrete mass however is identified. The spleen is normal.  The adrenal glands are both unremarkable. Normal appearance of the kidneys.  The urinary bladder appears normal. The prostate gland and seminal vesicles are unremarkable.  There is a normal caliber of the abdominal aorta. No upper abdominal adenopathy identified. No pelvic or inguinal adenopathy identified.  The stomach and small bowel loops have a normal course and caliber. The proximal colon appears normal. Multiple distal colonic diverticula identified.  Review of the visualized osseous structures demonstrates no aggressive lytic or sclerotic bone lesion.  IMPRESSION: 1. There is relative hypoenhancement of the uncinate process of the pancreas when compared with the head, body and tail of pancreas. Additionally, there is loss of the fat plane between the uncinate process of the pancreas and the superior mesenteric artery, image 42/series 5. These may be indirect findings of adenocarcinoma of the pancreas. However, no discrete mass however is identified.  2. There is no upper abdominal adenopathy noted.  3. Right hepatic lobe cyst.   Electronically Signed   By: Taylor  Stroud M.D.   On: 05/23/2013 15:38   Ct Abdomen Pelvis W Contrast  05/24/2013   CLINICAL DATA:  Abdominal pain.  EXAM: CT ABDOMEN AND PELVIS WITH CONTRAST  TECHNIQUE: Multidetector CT imaging of the abdomen and pelvis was performed using the standard protocol following bolus administration of intravenous contrast.  CONTRAST:  50mL OMNIPAQUE IOHEXOL 300 MG/ML SOLN, 100mL OMNIPAQUE IOHEXOL 300 MG/ML SOLN  COMPARISON:  05/23/2013 a  FINDINGS: There is increasing intrahepatic and extrahepatic biliary ductal dilatation since yesterday's study. The endo biliary stent appears to have migrated distally somewhat. The tip remains in the mid to distal common bile duct then courses into the transverse duodenum. Pneumobilia. Mild distention of the gallbladder with gallbladder wall thickening.  No well-defined pancreatic mass/lesion. No pancreatic ductal dilatation. Small scattered hypodensities in the liver are stable, likely small  cysts. Spleen, pancreas, adrenals and kidneys are unremarkable. Small low-density area within the midpole of the right kidney anteriorly, likely small cyst. No hydronephrosis.  Sigmoid diverticulosis. No active diverticulitis. Small bowel is decompressed. No free fluid, free air or adenopathy. Urinary bladder is unremarkable. Appendix is visualized and is normal.  Aorta and iliac vessels are calcified, non aneurysmal.  No acute bony abnormality.  IMPRESSION: Worsening intrahepatic and extrahepatic biliary   ductal dilatation. Increasing distention of the gallbladder with mild gallbladder wall thickening. The biliary stent appears to migrated slightly distally but remains in the mid to distal common bile duct and extending into the transverse duodenum.  Sigmoid diverticulosis.   Electronically Signed   By: Kevin  Dover M.D.   On: 05/24/2013 20:59   Dg Chest Port 1 View  05/24/2013   CLINICAL DATA:  Abdominal pain, shortness of breath  EXAM: PORTABLE CHEST - 1 VIEW  COMPARISON:  05/22/2009  FINDINGS: Heart is normal size. Patchy left perihilar and lower lobe atelectasis or infiltrate. Right lung is clear. No visible effusion. No acute bony abnormality.  IMPRESSION: Question early infiltrate versus atelectasis in the left perihilar and lower lobe regions.   Electronically Signed   By: Kevin  Dover M.D.   On: 05/24/2013 18:42     Previous Endoscopies: See HPI  Impression / Plan:   Acute severe abdominal pain , 3 days post biliary stent placement  for CBD obstruction, so far no evidence of malignancy, r/o stent obstruction, possible cholangitis, Chronic pancreatitis by EUS, r/o acute pancreatitis, lipase unchanged, WBC  Ct scan evidence of increasing intrahepatic duct dilation but at the same time Improvement in serum bilirubin since 2 days ago, will need a follow up imaging and monitoring of the bilirubin and LFT's, no evidence of intraperitoneal air or fluid collection Cipro/Flagyl IV- allergic to  PCN Vigorous IV hydration as if in acute pancreatitis, while monitoring  PO2 ( FiO2 88 %), Consider possibility of a stent exchange  Or irrigation to assure patency , he has pneumobilia  Secondary to sphincterotomy Pain control- may need dilaudid pump    LOS: 0 days   Ashawnti Tangen  05/24/2013, 9:06 PM     

## 2013-05-26 NOTE — Anesthesia Postprocedure Evaluation (Signed)
Anesthesia Post Note  Patient: Terry Byrd  Procedure(s) Performed: Procedure(s) (LRB): ENDOSCOPIC RETROGRADE CHOLANGIOPANCREATOGRAPHY (ERCP) (N/A)  Anesthesia type: General  Patient location: PACU  Post pain: Pain level controlled  Post assessment: Post-op Vital signs reviewed  Last Vitals: BP 115/88  Pulse 93  Temp(Src) 37.1 C (Oral)  Resp 12  Ht 5\' 10"  (1.778 m)  Wt 210 lb 11.2 oz (95.573 kg)  BMI 30.23 kg/m2  SpO2 96%  Post vital signs: Reviewed  Level of consciousness: sedated  Complications: No apparent anesthesia complications

## 2013-05-27 DIAGNOSIS — B952 Enterococcus as the cause of diseases classified elsewhere: Secondary | ICD-10-CM

## 2013-05-27 DIAGNOSIS — K869 Disease of pancreas, unspecified: Secondary | ICD-10-CM

## 2013-05-27 LAB — HEPATIC FUNCTION PANEL
ALT: 450 U/L — ABNORMAL HIGH (ref 0–53)
AST: 243 U/L — ABNORMAL HIGH (ref 0–37)
Albumin: 2.2 g/dL — ABNORMAL LOW (ref 3.5–5.2)
Albumin: 2.2 g/dL — ABNORMAL LOW (ref 3.5–5.2)
Alkaline Phosphatase: 352 U/L — ABNORMAL HIGH (ref 39–117)
Bilirubin, Direct: 4.3 mg/dL — ABNORMAL HIGH (ref 0.0–0.3)
Indirect Bilirubin: 1.2 mg/dL — ABNORMAL HIGH (ref 0.3–0.9)
Total Bilirubin: 6.9 mg/dL — ABNORMAL HIGH (ref 0.3–1.2)
Total Bilirubin: 5.5 mg/dL — ABNORMAL HIGH (ref 0.3–1.2)
Total Protein: 5.2 g/dL — ABNORMAL LOW (ref 6.0–8.3)
Total Protein: 5.5 g/dL — ABNORMAL LOW (ref 6.0–8.3)

## 2013-05-27 MED ORDER — GENTAMICIN IN SALINE 1.6-0.9 MG/ML-% IV SOLN
80.0000 mg | Freq: Three times a day (TID) | INTRAVENOUS | Status: DC
  Administered 2013-05-27 – 2013-05-31 (×12): 80 mg via INTRAVENOUS
  Filled 2013-05-27 (×14): qty 50

## 2013-05-27 MED ORDER — VANCOMYCIN HCL 10 G IV SOLR
1250.0000 mg | Freq: Three times a day (TID) | INTRAVENOUS | Status: DC
  Administered 2013-05-27 – 2013-05-28 (×4): 1250 mg via INTRAVENOUS
  Filled 2013-05-27 (×6): qty 1250

## 2013-05-27 MED ORDER — SODIUM CHLORIDE 0.9 % IV SOLN: INTRAVENOUS | Status: DC

## 2013-05-27 NOTE — Progress Notes (Signed)
ANTIBIOTIC CONSULT NOTE - FOLLOW UP  Pharmacy Consult for vancomycin & Gentamicin Indication: Enterococcal bacteremia/endocarditis   Allergies  Allergen Reactions  . Codeine     REACTION: nausea/vomiting  . Penicillins Rash    Patient Measurements: Height: 5\' 10"  (177.8 cm) Weight: 210 lb 11.2 oz (95.573 kg) IBW/kg (Calculated) : 73 Adjusted Body Weight: 82 kg  Vital Signs: Temp: 98.4 F (36.9 C) (10/21 1513) Temp src: Oral (10/21 1513) BP: 138/83 mmHg (10/21 1513) Pulse Rate: 81 (10/21 1513) Intake/Output from previous day: 10/20 0701 - 10/21 0700 In: 4095.5 [P.O.:480; I.V.:3015.5; IV Piggyback:600] Out: 3225 [Urine:3225] Intake/Output from this shift: Total I/O In: 123 [P.O.:120; I.V.:3] Out: 1400 [Urine:1400]  Labs:  Recent Labs  05/24/13 1935 05/25/13 0510 05/26/13 0320  WBC 10.0 15.0*  --   HGB 14.5 13.4  --   PLT 230 179  --   CREATININE 0.90 1.09 0.80   Estimated Creatinine Clearance: 118.2 ml/min (by C-G formula based on Cr of 0.8).  Recent Labs  05/27/13 0130  VANCOTROUGH 9.9*     Microbiology: Recent Results (from the past 720 hour(s))  URINE CULTURE     Status: None   Collection Time    05/12/13  4:37 PM      Result Value Range Status   Colony Count NO GROWTH   Final   Organism ID, Bacteria NO GROWTH   Final  CULTURE, BLOOD (ROUTINE X 2)     Status: None   Collection Time    05/24/13  9:45 PM      Result Value Range Status   Specimen Description BLOOD LEFT ANTECUBITAL   Final   Special Requests BOTTLES DRAWN AEROBIC AND ANAEROBIC 5CC   Final   Culture  Setup Time     Final   Value: 05/25/2013 01:21     Performed at [facility]   Culture     Final   Value: ENTEROCOCCUS SPECIES     Note: Gram Stain Report Called to,Read Back By and Verified With: JENNY BURNS 05/25/13 @ 12:40PM BY RUSCOE A.     Performed at [facility]   Report Status PENDING   Incomplete  CULTURE, BLOOD (ROUTINE X 2)     Status: None   Collection  Time    05/24/13 10:00 PM      Result Value Range Status   Specimen Description BLOOD LEFT HAND   Final   Special Requests BOTTLES DRAWN AEROBIC AND ANAEROBIC 5CC   Final   Culture  Setup Time     Final   Value: 05/25/2013 01:20     Performed at [facility]   Culture     Final   Value: ENTEROCOCCUS SPECIES     Note: Gram Stain Report Called to,Read Back By and Verified With: JENNY BURNS 05/25/13 @ 12:40PM BY RUSCOE A.     Performed at [facility]   Report Status PENDING   Incomplete  CULTURE, BLOOD (ROUTINE X 2)     Status: None   Collection Time    05/26/13  3:27 PM      Result Value Range Status   Specimen Description BLOOD LEFT HAND   Final   Special Requests BOTTLES DRAWN AEROBIC AND ANAEROBIC 5CC   Final   Culture  Setup Time     Final   Value: 05/26/2013 20:42     Performed at [facility]   Culture     Final   Value:  BLOOD CULTURE RECEIVED NO GROWTH TO DATE CULTURE WILL BE HELD FOR 5 DAYS BEFORE ISSUING A FINAL NEGATIVE REPORT     Performed at [facility]   Report Status PENDING   Incomplete  SURGICAL PCR SCREEN     Status: Abnormal   Collection Time    05/26/13  3:30 PM      Result Value Range Status   MRSA, PCR INVALID RESULTS, SPECIMEN SENT FOR CULTURE (*) NEGATIVE Final   Comment: RCRV     A THOMPSON RN 1902 05/26/13 A NAVARRO   Staphylococcus aureus INVALID RESULTS, SPECIMEN SENT FOR CULTURE (*) NEGATIVE Final   Comment:            The Xpert SA Assay (FDA     approved for NASAL specimens     in patients over 72 years of age),     is one component of     a comprehensive surveillance     program.  Test performance has     been validated by [employer] for patients greater     than or equal to 67 year old.     It is not intended     to diagnose infection nor to     guide or monitor treatment.  CULTURE, BLOOD (ROUTINE X 2)     Status: None   Collection Time    05/26/13  3:34 PM      Result Value Range Status    Specimen Description BLOOD RIGHT HAND   Final   Special Requests BOTTLES DRAWN AEROBIC ONLY 3CC   Final   Culture  Setup Time     Final   Value: 05/26/2013 20:39     Performed at [facility]   Culture     Final   Value: GRAM POSITIVE COCCI IN PAIRS AND CHAINS     Note: Gram Stain Report Called to,Read Back By and Verified With: ANNIE H @ 1400 05/27/13 BY KRAWS     Performed at [facility]   Report Status PENDING   Incomplete    Anti-infectives   Start     Dose/Rate Route Frequency Ordered Stop   05/27/13 0800  vancomycin (VANCOCIN) 1,250 mg in sodium chloride 0.9 % 250 mL IVPB     1,250 mg 166.7 mL/hr over 90 Minutes Intravenous Every 8 hours 05/27/13 0340     05/25/13 1200  imipenem-cilastatin (PRIMAXIN) 500 mg in sodium chloride 0.9 % 100 mL IVPB     500 mg 200 mL/hr over 30 Minutes Intravenous 4 times per day 05/25/13 0854     05/25/13 0030  vancomycin (VANCOCIN) IVPB 1000 mg/200 mL premix  Status:  Discontinued     1,000 mg 200 mL/hr over 60 Minutes Intravenous Every 8 hours 05/25/13 0000 05/27/13 0340   05/24/13 2359  imipenem-cilastatin (PRIMAXIN) 500 mg in sodium chloride 0.9 % 100 mL IVPB  Status:  Discontinued     500 mg 200 mL/hr over 30 Minutes Intravenous Every 8 hours 05/24/13 2334 05/25/13 0854   05/24/13 1930  ciprofloxacin (CIPRO) IVPB 400 mg     400 mg 200 mL/hr over 60 Minutes Intravenous  Once 05/24/13 1905 05/24/13 2137   05/24/13 1915  metroNIDAZOLE (FLAGYL) IVPB 500 mg     500 mg 100 mL/hr over 60 Minutes Intravenous  Once 05/24/13 1905 05/24/13 2254      Assessment: Patient with presented with sepsis secondary to acute chlangitis.  Patient with common duct stent  placement.  Vancomycin and Primaxin started 10/19.  Blood cultures positive for enterococcus.  IV Gentamicin per pharmacy ordered to begin to provide gram + synergy for endocarditis.  ID recommends TEE, which is scheduled for 10/22.  Goal of Therapy:  Vancomycin trough level  15-20 mcg/ml Gentamicin peak level 3-4 mcg/ml Gentamicin trough level < 1 mcg/ml  Plan:  Begin Gentamicin 80mg  IV q8h Continue Vancomycin 1250mg  IV q8h  Measure antibiotic drug levels at steady state Follow up culture results    Triana Coover, Joselyn Glassman, PharmD 05/27/2013,3:30 PM

## 2013-05-27 NOTE — Progress Notes (Signed)
[facility] PROGRESS NOTE  Terry Byrd AVW:[id number] DOB: 28-Oct-1955 DOA: 05/24/2013 PCP: [name], MD  Assessment/Plan: Enterococcus speicies Sepsis  -continue current vanc and imipenem pending culture sens -ECHO with no vegetations reported, discussed with ID and TEE recommended>>LB cards consulted -Secondary to acute cholangitis  -continue IVF, BP stable  -await echo to eval for vegetations, I have consulted ID Acute cholangitis  -Secondary to biliary stent migration  -Blood cultures x2 sets--( noted that blood cultures were obtained after the patient was given Cipro and Flagyl) enterococcus as above -continue vancomycin and imipenem pending culture sens -UA neg -appreciate GI assistance-plan for repeat ERCP/stent today, but due to increase in pain  T.bili continuing to trend down today -appreciate general surgery-per Dr. Gerrit Friends family states they are to see surgeon at [facility] Uncontrolled pain  -Secondary to above -continue Dilaudid PCA pumpfor now, follow and change to prn analgesics as pain improves -Valium 5 mg every 8 hours when necessary muscle spasm  Elevated LFTs -likely due to biliary stent migration  Probable HCAP   -continue empiric vanc/imipenem as above and follow      Code Status: FULL Family Communication: Wife at bedside  Disposition Plan: to home when medically ready   Consultants:  GI  ID  Cards for TEE  Procedures:  Echo  Study Conclusions  Left ventricle: The cavity size was normal. Wall thickness was increased in a pattern of mild LVH. Systolic function was vigorous. The estimated ejection fraction was in the range of 65% to 70%. Although no diagnostic regional wall motion abnormality was identified, this possibility cannot be completely excluded on the basis of this study. Left ventricular diastolic function parameters were normal.     ERCP- with STENT PLACEMENT per GI  Antibiotics:  vanc and imipenem started  10/18  HPI/Subjective: States abd pain about 2-3/10, tolerating clears  Objective: Filed Vitals:   05/27/13 0830  BP:   Pulse:   Temp: 98.3 F (36.8 C)  Resp:     Intake/Output Summary (Last 24 hours) at 05/27/13 1140 Last data filed at 05/27/13 0650  Gross per 24 hour  Intake 4095.53 ml  Output   3225 ml  Net 870.53 ml   Filed Weights   05/24/13 2346  Weight: 95.573 kg (210 lb 11.2 oz)   Exam:  General: alert & oriented x 3 In NAD, jaundiced Cardiovascular: RRR, nl S1 s2 Respiratory: CTAB Abdomen: soft +BS NT/ND, no masses palpable Extremities: No cyanosis and no edema    Data Reviewed: Basic Metabolic Panel:  Recent Labs Lab 05/21/13 1225 05/24/13 1935 05/25/13 0510 05/26/13 0320  NA 134* 135 135 132*  K 3.9 4.2 4.0 3.8  CL 102 101 101 98  CO2 25 23 21 27   GLUCOSE 102* 125* 132* 136*  BUN 17 21 23 17   CREATININE 0.95 0.90 1.09 0.80  CALCIUM 8.9 8.8 9.0 8.6   Liver Function Tests:  Recent Labs Lab 05/25/13 0510 05/25/13 1435 05/26/13 0320 05/26/13 1409 05/27/13 0129  AST 436* 464* 357* 326* 243*  ALT 652* 748* 603* 562* 450*  ALKPHOS 326* 339* 295* 332* 352*  BILITOT 8.9* 10.2* 8.3* 8.1* 6.9*  PROT 5.8* 6.1 5.4* 5.6* 5.2*  ALBUMIN 2.8* 3.0* 2.5* 2.4* 2.2*    Recent Labs Lab 05/24/13 1935 05/26/13 0320  LIPASE 66* 13  AMYLASE  --  36   No results found for this basename: AMMONIA,  in the last 168 hours CBC:  Recent Labs Lab 05/24/13 1935 05/25/13 0510  WBC  10.0 15.0*  NEUTROABS 8.9* 13.6*  HGB 14.5 13.4  HCT 43.0 39.9  MCV 89.4 90.7  PLT 230 179   Cardiac Enzymes: No results found for this basename: CKTOTAL, CKMB, CKMBINDEX, TROPONINI,  in the last 168 hours BNP (last 3 results) No results found for this basename: PROBNP,  in the last 8760 hours CBG: No results found for this basename: GLUCAP,  in the last 168 hours  Recent Results (from the past 240 hour(s))  CULTURE, BLOOD (ROUTINE X 2)     Status: None   Collection  Time    05/24/13  9:45 PM      Result Value Range Status   Specimen Description BLOOD LEFT ANTECUBITAL   Final   Special Requests BOTTLES DRAWN AEROBIC AND ANAEROBIC 5CC   Final   Culture  Setup Time     Final   Value: 05/25/2013 01:21     Performed at [facility]   Culture     Final   Value: ENTEROCOCCUS SPECIES     Note: Gram Stain Report Called to,Read Back By and Verified With: JENNY BURNS 05/25/13 @ 12:40PM BY RUSCOE A.     Performed at [facility]   Report Status PENDING   Incomplete  CULTURE, BLOOD (ROUTINE X 2)     Status: None   Collection Time    05/24/13 10:00 PM      Result Value Range Status   Specimen Description BLOOD LEFT HAND   Final   Special Requests BOTTLES DRAWN AEROBIC AND ANAEROBIC 5CC   Final   Culture  Setup Time     Final   Value: 05/25/2013 01:20     Performed at [facility]   Culture     Final   Value: ENTEROCOCCUS SPECIES     Note: Gram Stain Report Called to,Read Back By and Verified With: JENNY BURNS 05/25/13 @ 12:40PM BY RUSCOE A.     Performed at [facility]   Report Status PENDING   Incomplete  CULTURE, BLOOD (ROUTINE X 2)     Status: None   Collection Time    05/26/13  3:27 PM      Result Value Range Status   Specimen Description BLOOD LEFT HAND   Final   Special Requests BOTTLES DRAWN AEROBIC AND ANAEROBIC 5CC   Final   Culture  Setup Time     Final   Value: 05/26/2013 20:42     Performed at [facility]   Culture     Final   Value:        BLOOD CULTURE RECEIVED NO GROWTH TO DATE CULTURE WILL BE HELD FOR 5 DAYS BEFORE ISSUING A FINAL NEGATIVE REPORT     Performed at [facility]   Report Status PENDING   Incomplete  SURGICAL PCR SCREEN     Status: Abnormal   Collection Time    05/26/13  3:30 PM      Result Value Range Status   MRSA, PCR INVALID RESULTS, SPECIMEN SENT FOR CULTURE (*) NEGATIVE Final   Comment: RCRV     A THOMPSON RN 1902 05/26/13 A NAVARRO   Staphylococcus aureus  INVALID RESULTS, SPECIMEN SENT FOR CULTURE (*) NEGATIVE Final   Comment:            The Xpert SA Assay (FDA     approved for NASAL specimens     in patients over 20 years of age),     is one component of  a comprehensive surveillance     program.  Test performance has     been validated by [facility] for patients greater     than or equal to 96 year old.     It is not intended     to diagnose infection nor to     guide or monitor treatment.  CULTURE, BLOOD (ROUTINE X 2)     Status: None   Collection Time    05/26/13  3:34 PM      Result Value Range Status   Specimen Description BLOOD RIGHT HAND   Final   Special Requests BOTTLES DRAWN AEROBIC ONLY 3CC   Final   Culture  Setup Time     Final   Value: 05/26/2013 20:39     Performed at [facility]   Culture     Final   Value:        BLOOD CULTURE RECEIVED NO GROWTH TO DATE CULTURE WILL BE HELD FOR 5 DAYS BEFORE ISSUING A FINAL NEGATIVE REPORT     Performed at [facility]   Report Status PENDING   Incomplete     Studies: Dg Chest 2 View  05/25/2013   CLINICAL DATA:  Increase in chest pain and shortness of Breath.  EXAM: CHEST  2 VIEW  COMPARISON:  05/24/2013  FINDINGS: On the lateral view, there is relative increased opacity posteriorly near the lung base. This may reflect a small left medial lower lobe infiltrate. It may be atelectasis accentuated by respiratory motion.  The lungs are otherwise clear. No pleural effusion or pneumothorax.  The heart, mediastinum and hila are unremarkable.  IMPRESSION: 1. Left medial lung base opacity. This could reflect infiltrate or atelectasis. It is best visualized on the lateral view. 2. No other abnormalities.   Electronically Signed   By: Amie Portland M.D.   On: 05/25/2013 12:32   Dg Ercp  05/26/2013   CLINICAL DATA:  Cholangitis, common duct stent placement  EXAM: ERCP  TECHNIQUE: Multiple spot images obtained with the fluoroscopic device and submitted for  interpretation post-procedure.  COMPARISON:  Prior CT 05/24/2013 and 05/23/2013, abdominal ultrasound 05/21/2013  FINDINGS: On initial injection, there is transient ovoid filling defect at the mid portion of the dilated common duct, see for example image labeled 4. This could be due to flow phenomenon although the presence of stones, sludge, or other material could also account for this appearance. There is no focal caliber abnormality of the common duct. A plastic stent was placed. Contrast is not seen within the duodenum. Mild intrahepatic ductal dilatation is also identified.  IMPRESSION: Early appearance of mid common duct filling defect which could represent inflow phenomenon although a stone, sludge, or other material could also account for this appearance.  Common and intrahepatic ductal dilatation reidentified, with placement of a plastic stent.  These images were submitted for radiologic interpretation only. Please see the procedural report for the amount of contrast and the fluoroscopy time utilized.   Electronically Signed   By: Christiana Pellant M.D.   On: 05/26/2013 17:55    Scheduled Meds: . diphenhydrAMINE  12.5 mg Intravenous Once  . enoxaparin (LOVENOX) injection  40 mg Subcutaneous Q24H  . HYDROmorphone PCA 0.3 mg/mL   Intravenous Q4H  . imipenem-cilastatin  500 mg Intravenous Q6H  . sodium chloride  3 mL Intravenous Q12H  . vancomycin  1,250 mg Intravenous Q8H   Continuous Infusions: . sodium chloride 100 mL/hr at 05/27/13 0211  Active Problems:   Pancreatic mass   Obstruction of bile duct   Sepsis   Acute cholangitis   Transaminasemia    Time spent: 35    [facility]  [facility] Pager [phone]. If 7PM-7AM, please contact night-coverage at [email or web address], password [facility] 05/27/2013, 11:40 AM  LOS: 3 days

## 2013-05-27 NOTE — Progress Notes (Signed)
ANTIBIOTIC CONSULT NOTE - FOLLOW UP  Pharmacy Consult for vancomycin Indication: Sepsis secondary to acute chlangitis   Allergies  Allergen Reactions  . Codeine     REACTION: nausea/vomiting  . Penicillins Rash    Patient Measurements: Height: 5\' 10"  (177.8 cm) Weight: 210 lb 11.2 oz (95.573 kg) IBW/kg (Calculated) : 73 Adjusted Body Weight:   Vital Signs: Temp: 97.4 F (36.3 C) (10/20 2125) Temp src: Oral (10/20 2125) BP: 125/73 mmHg (10/20 2125) Pulse Rate: 88 (10/20 2125) Intake/Output from previous day: 10/20 0701 - 10/21 0700 In: 2305.5 [I.V.:1705.5; IV Piggyback:600] Out: 1975 [Urine:1975] Intake/Output from this shift: Total I/O In: -  Out: 925 [Urine:925]  Labs:  Recent Labs  05/24/13 1935 05/25/13 0510 05/26/13 0320  WBC 10.0 15.0*  --   HGB 14.5 13.4  --   PLT 230 179  --   CREATININE 0.90 1.09 0.80   Estimated Creatinine Clearance: 118.2 ml/min (by C-G formula based on Cr of 0.8).  Recent Labs  05/27/13 0130  VANCOTROUGH 9.9*     Microbiology: Recent Results (from the past 720 hour(s))  URINE CULTURE     Status: None   Collection Time    05/12/13  4:37 PM      Result Value Range Status   Colony Count NO GROWTH   Final   Organism ID, Bacteria NO GROWTH   Final  CULTURE, BLOOD (ROUTINE X 2)     Status: None   Collection Time    05/24/13  9:45 PM      Result Value Range Status   Specimen Description BLOOD LEFT ANTECUBITAL   Final   Special Requests BOTTLES DRAWN AEROBIC AND ANAEROBIC 5CC   Final   Culture  Setup Time     Final   Value: 05/25/2013 01:21     Performed at [facility]   Culture     Final   Value: GRAM POSITIVE COCCI IN CHAINS     Note: Gram Stain Report Called to,Read Back By and Verified With: JENNY BURNS 05/25/13 @ 12:40PM BY RUSCOE A.     Performed at [facility]   Report Status PENDING   Incomplete  CULTURE, BLOOD (ROUTINE X 2)     Status: None   Collection Time    05/24/13 10:00 PM      Result  Value Range Status   Specimen Description BLOOD LEFT HAND   Final   Special Requests BOTTLES DRAWN AEROBIC AND ANAEROBIC 5CC   Final   Culture  Setup Time     Final   Value: 05/25/2013 01:20     Performed at [facility]   Culture     Final   Value: GRAM POSITIVE COCCI IN CHAINS     Note: Gram Stain Report Called to,Read Back By and Verified With: JENNY BURNS 05/25/13 @ 12:40PM BY RUSCOE A.     Performed at [facility]   Report Status PENDING   Incomplete  SURGICAL PCR SCREEN     Status: Abnormal   Collection Time    05/26/13  3:30 PM      Result Value Range Status   MRSA, PCR INVALID RESULTS, SPECIMEN SENT FOR CULTURE (*) NEGATIVE Final   Comment: RCRV     A THOMPSON RN 1902 05/26/13 A NAVARRO   Staphylococcus aureus INVALID RESULTS, SPECIMEN SENT FOR CULTURE (*) NEGATIVE Final   Comment:            The Xpert SA Assay (FDA  approved for NASAL specimens     in patients over 25 years of age),     is one component of     a comprehensive surveillance     program.  Test performance has     been validated by [employer] for patients greater     than or equal to 38 year old.     It is not intended     to diagnose infection nor to     guide or monitor treatment.    Anti-infectives   Start     Dose/Rate Route Frequency Ordered Stop   05/27/13 0800  vancomycin (VANCOCIN) 1,250 mg in sodium chloride 0.9 % 250 mL IVPB     1,250 mg 166.7 mL/hr over 90 Minutes Intravenous Every 8 hours 05/27/13 0340     05/25/13 1200  imipenem-cilastatin (PRIMAXIN) 500 mg in sodium chloride 0.9 % 100 mL IVPB     500 mg 200 mL/hr over 30 Minutes Intravenous 4 times per day 05/25/13 0854     05/25/13 0030  vancomycin (VANCOCIN) IVPB 1000 mg/200 mL premix  Status:  Discontinued     1,000 mg 200 mL/hr over 60 Minutes Intravenous Every 8 hours 05/25/13 0000 05/27/13 0340   05/24/13 2359  imipenem-cilastatin (PRIMAXIN) 500 mg in sodium chloride 0.9 % 100 mL IVPB  Status:   Discontinued     500 mg 200 mL/hr over 30 Minutes Intravenous Every 8 hours 05/24/13 2334 05/25/13 0854   05/24/13 1930  ciprofloxacin (CIPRO) IVPB 400 mg     400 mg 200 mL/hr over 60 Minutes Intravenous  Once 05/24/13 1905 05/24/13 2137   05/24/13 1915  metroNIDAZOLE (FLAGYL) IVPB 500 mg     500 mg 100 mL/hr over 60 Minutes Intravenous  Once 05/24/13 1905 05/24/13 2254      Assessment: Patient with Sepsis secondary to acute chlangitis.  Vancomycin trough low at 9.9.  Doses charted correctly.   Goal of Therapy:  Vancomycin trough level 15-20 mcg/ml  Plan:  Measure antibiotic drug levels at steady state Follow up culture results Change to 1250mg  iv q8hr, next dose at 0800  Darlina Guys, Jacquenette Shone Crowford 05/27/2013,3:42 AM

## 2013-05-27 NOTE — Progress Notes (Signed)
Progress Note   Subjective  Feeling much better today. Minimal epigastric pain. Tolerating clear liquids.   Objective  Vital signs in last 24 hours: Temp:  [97.4 F (36.3 C)-98.8 F (37.1 C)] 98.3 F (36.8 C) (10/21 0830) Pulse Rate:  [78-97] 78 (10/21 0500) Resp:  [10-18] 18 (10/21 0500) BP: (109-130)/(58-90) 130/79 mmHg (10/21 0500) SpO2:  [93 %-100 %] 98 % (10/21 0500) Last BM Date: 05/21/13 General:   Alert, well-developed, jaundiced male in NAD Heart:  Regular rate and rhythm; no murmurs Abdomen:  Soft, minimal epigastric tenderness and nondistended. Normal bowel sounds, without guarding, and without rebound.   Extremities:  Without edema. Neurologic:  Alert and  oriented x4;  grossly normal neurologically. Psych:  Alert and cooperative. Normal mood and affect.  Intake/Output from previous day: 10/20 0701 - 10/21 0700 In: 4095.5 [P.O.:480; I.V.:3015.5; IV Piggyback:600] Out: 3225 [Urine:3225] Intake/Output this shift:    Lab Results:  Recent Labs  05/24/13 1935 05/25/13 0510  WBC 10.0 15.0*  HGB 14.5 13.4  HCT 43.0 39.9  PLT 230 179   BMET  Recent Labs  05/24/13 1935 05/25/13 0510 05/26/13 0320  NA 135 135 132*  K 4.2 4.0 3.8  CL 101 101 98  CO2 23 21 27   GLUCOSE 125* 132* 136*  BUN 21 23 17   CREATININE 0.90 1.09 0.80  CALCIUM 8.8 9.0 8.6   LFT  Recent Labs  05/27/13 0129  PROT 5.2*  ALBUMIN 2.2*  AST 243*  ALT 450*  ALKPHOS 352*  BILITOT 6.9*  BILIDIR 5.7*  IBILI 1.2*    Studies/Results: Dg Chest 2 View  05/25/2013   CLINICAL DATA:  Increase in chest pain and shortness of Breath.  EXAM: CHEST  2 VIEW  COMPARISON:  05/24/2013  FINDINGS: On the lateral view, there is relative increased opacity posteriorly near the lung base. This may reflect a small left medial lower lobe infiltrate. It may be atelectasis accentuated by respiratory motion.  The lungs are otherwise clear. No pleural effusion or pneumothorax.  The heart, mediastinum  and hila are unremarkable.  IMPRESSION: 1. Left medial lung base opacity. This could reflect infiltrate or atelectasis. It is best visualized on the lateral view. 2. No other abnormalities.   Electronically Signed   By: Amie Portland M.D.   On: 05/25/2013 12:32   Dg Ercp  05/26/2013   CLINICAL DATA:  Cholangitis, common duct stent placement  EXAM: ERCP  TECHNIQUE: Multiple spot images obtained with the fluoroscopic device and submitted for interpretation post-procedure.  COMPARISON:  Prior CT 05/24/2013 and 05/23/2013, abdominal ultrasound 05/21/2013  FINDINGS: On initial injection, there is transient ovoid filling defect at the mid portion of the dilated common duct, see for example image labeled 4. This could be due to flow phenomenon although the presence of stones, sludge, or other material could also account for this appearance. There is no focal caliber abnormality of the common duct. A plastic stent was placed. Contrast is not seen within the duodenum. Mild intrahepatic ductal dilatation is also identified.  IMPRESSION: Early appearance of mid common duct filling defect which could represent inflow phenomenon although a stone, sludge, or other material could also account for this appearance.  Common and intrahepatic ductal dilatation reidentified, with placement of a plastic stent.  These images were submitted for radiologic interpretation only. Please see the procedural report for the amount of contrast and the fluoroscopy time utilized.   Electronically Signed   By: Lucio Edward.D.  On: 05/26/2013 17:55     Assessment & Plan   1. 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 R/O CBD stricture from chronic pancreatitis or pancreatic neoplasm. ERCP with biliary stent exchange yesterday. Transaminases and t bili have improved.  2. Acute cholangitis, imporved. Enterococcus in blood culture secondary to cholangitis. Continue antibiotics per ID recommendations.  Transaminases and t bili have improved. With biliary stent functioning and in good position he should respond well to antibiotics.    LOS: 3 days   Judie Petit T. Russella Dar  MD [facility] 05/27/2013, 9:36 AM

## 2013-05-27 NOTE — Progress Notes (Signed)
[facility]  Day #4 imipenem Day # 4 vancomycin   Subjective: Sleepy this afternoon   Antibiotics:  Anti-infectives   Start     Dose/Rate Route Frequency Ordered Stop   05/27/13 0800  vancomycin (VANCOCIN) 1,250 mg in sodium chloride 0.9 % 250 mL IVPB     1,250 mg 166.7 mL/hr over 90 Minutes Intravenous Every 8 hours 05/27/13 0340     05/25/13 1200  imipenem-cilastatin (PRIMAXIN) 500 mg in sodium chloride 0.9 % 100 mL IVPB     500 mg 200 mL/hr over 30 Minutes Intravenous 4 times per day 05/25/13 0854     05/25/13 0030  vancomycin (VANCOCIN) IVPB 1000 mg/200 mL premix  Status:  Discontinued     1,000 mg 200 mL/hr over 60 Minutes Intravenous Every 8 hours 05/25/13 0000 05/27/13 0340   05/24/13 2359  imipenem-cilastatin (PRIMAXIN) 500 mg in sodium chloride 0.9 % 100 mL IVPB  Status:  Discontinued     500 mg 200 mL/hr over 30 Minutes Intravenous Every 8 hours 05/24/13 2334 05/25/13 0854   05/24/13 1930  ciprofloxacin (CIPRO) IVPB 400 mg     400 mg 200 mL/hr over 60 Minutes Intravenous  Once 05/24/13 1905 05/24/13 2137   05/24/13 1915  metroNIDAZOLE (FLAGYL) IVPB 500 mg     500 mg 100 mL/hr over 60 Minutes Intravenous  Once 05/24/13 1905 05/24/13 2254      Medications: Scheduled Meds: . diphenhydrAMINE  12.5 mg Intravenous Once  . enoxaparin (LOVENOX) injection  40 mg Subcutaneous Q24H  . HYDROmorphone PCA 0.3 mg/mL   Intravenous Q4H  . imipenem-cilastatin  500 mg Intravenous Q6H  . sodium chloride  3 mL Intravenous Q12H  . vancomycin  1,250 mg Intravenous Q8H   Continuous Infusions: . sodium chloride 100 mL/hr at 05/27/13 0211   PRN Meds:.calcium carbonate, hydrOXYzine, ondansetron (ZOFRAN) IV, ondansetron   Objective: Weight change:   Intake/Output Summary (Last 24 hours) at 05/27/13 1452 Last data filed at 05/27/13 1149  Gross per 24 hour  Intake 4098.53 ml  Output   3125 ml  Net 973.53 ml   Blood pressure 130/79, pulse 78,  temperature 98.3 F (36.8 C), temperature source Oral, resp. rate 16, height 5\' 10"  (1.778 m), weight 210 lb 11.2 oz (95.573 kg), SpO2 93.00%. Temp:  [97.4 F (36.3 C)-98.8 F (37.1 C)] 98.3 F (36.8 C) (10/21 0830) Pulse Rate:  [78-97] 78 (10/21 0500) Resp:  [10-18] 16 (10/21 1144) BP: (115-130)/(73-90) 130/79 mmHg (10/21 0500) SpO2:  [93 %-100 %] 93 % (10/21 1144)  Physical Exam: General: Alert and awake, oriented x3, not in any acute distress, jaundiced  HEENT: icteric sclera,EOMI, oropharynx clear and without exudate  CVS tachy rate, normal r, no murmur rubs or gallops  Chest: clear to auscultation bilaterally, no wheezing, rales or rhonchi  Abdomen: soft TTP RUQ, nondistended, normal bowel sounds,  Extremities: no clubbing or edema noted bilaterally  Skin: no rashes  Neuro: nonfocal, strength and sensation intact  Lab Results:BMET  Micro Results: Recent Results (from the past 240 hour(s))  CULTURE, BLOOD (ROUTINE X 2)     Status: None   Collection Time    05/24/13  9:45 PM      Result Value Range Status   Specimen Description BLOOD LEFT ANTECUBITAL   Final   Special Requests BOTTLES DRAWN AEROBIC AND ANAEROBIC 5CC   Final   Culture  Setup Time     Final   Value: 05/25/2013 01:21  Performed at [facility]     Final   Value: ENTEROCOCCUS SPECIES     Note: Gram Stain Report Called to,Read Back By and Verified With: JENNY BURNS 05/25/13 @ 12:40PM BY RUSCOE A.     Performed at [facility]   Report Status PENDING   Incomplete  CULTURE, BLOOD (ROUTINE X 2)     Status: None   Collection Time    05/24/13 10:00 PM      Result Value Range Status   Specimen Description BLOOD LEFT HAND   Final   Special Requests BOTTLES DRAWN AEROBIC AND ANAEROBIC 5CC   Final   Culture  Setup Time     Final   Value: 05/25/2013 01:20     Performed at [facility]   Culture     Final   Value: ENTEROCOCCUS SPECIES     Note: Gram Stain Report Called to,Read  Back By and Verified With: JENNY BURNS 05/25/13 @ 12:40PM BY RUSCOE A.     Performed at [facility]   Report Status PENDING   Incomplete  CULTURE, BLOOD (ROUTINE X 2)     Status: None   Collection Time    05/26/13  3:27 PM      Result Value Range Status   Specimen Description BLOOD LEFT HAND   Final   Special Requests BOTTLES DRAWN AEROBIC AND ANAEROBIC 5CC   Final   Culture  Setup Time     Final   Value: 05/26/2013 20:42     Performed at [facility]   Culture     Final   Value:        BLOOD CULTURE RECEIVED NO GROWTH TO DATE CULTURE WILL BE HELD FOR 5 DAYS BEFORE ISSUING A FINAL NEGATIVE REPORT     Performed at [facility]   Report Status PENDING   Incomplete  SURGICAL PCR SCREEN     Status: Abnormal   Collection Time    05/26/13  3:30 PM      Result Value Range Status   MRSA, PCR INVALID RESULTS, SPECIMEN SENT FOR CULTURE (*) NEGATIVE Final   Comment: RCRV     A THOMPSON RN 1902 05/26/13 A NAVARRO   Staphylococcus aureus INVALID RESULTS, SPECIMEN SENT FOR CULTURE (*) NEGATIVE Final   Comment:            The Xpert SA Assay (FDA     approved for NASAL specimens     in patients over 19 years of age),     is one component of     a comprehensive surveillance     program.  Test performance has     been validated by [employer] for patients greater     than or equal to 31 year old.     It is not intended     to diagnose infection nor to     guide or monitor treatment.  CULTURE, BLOOD (ROUTINE X 2)     Status: None   Collection Time    05/26/13  3:34 PM      Result Value Range Status   Specimen Description BLOOD RIGHT HAND   Final   Special Requests BOTTLES DRAWN AEROBIC ONLY 3CC   Final   Culture  Setup Time     Final   Value: 05/26/2013 20:39     Performed at [facility]   Culture     Final  Value: GRAM POSITIVE COCCI IN PAIRS AND CHAINS     Note: Gram Stain Report Called to,Read Back By and Verified With: ANNIE H @ 1400 05/27/13 BY  KRAWS     Performed at [facility]   Report Status PENDING   Incomplete    Studies/Results: Dg Ercp  05/26/2013   CLINICAL DATA:  Cholangitis, common duct stent placement  EXAM: ERCP  TECHNIQUE: Multiple spot images obtained with the fluoroscopic device and submitted for interpretation post-procedure.  COMPARISON:  Prior CT 05/24/2013 and 05/23/2013, abdominal ultrasound 05/21/2013  FINDINGS: On initial injection, there is transient ovoid filling defect at the mid portion of the dilated common duct, see for example image labeled 4. This could be due to flow phenomenon although the presence of stones, sludge, or other material could also account for this appearance. There is no focal caliber abnormality of the common duct. A plastic stent was placed. Contrast is not seen within the duodenum. Mild intrahepatic ductal dilatation is also identified.  IMPRESSION: Early appearance of mid common duct filling defect which could represent inflow phenomenon although a stone, sludge, or other material could also account for this appearance.  Common and intrahepatic ductal dilatation reidentified, with placement of a plastic stent.  These images were submitted for radiologic interpretation only. Please see the procedural report for the amount of contrast and the fluoroscopy time utilized.   Electronically Signed   By: Christiana Pellant M.D.   On: 05/26/2013 17:55      Assessment/Plan: Terry Byrd is a 57 y.o. 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  CULTURES ARE PERSISTENTLY POSITIVE FOR ENTEROCOCCUS GROWING FROM CULTURE YESTERDAY  He then has TWO MAJOR criteria for endocarditis with typical organism and persistently positive cultures.  I DO THINK He needs TEE and have called Trish with [facility]  cardiology and pt is already scheduled for tomorrow.  I will add gentamicin    --REPEAT BLOOD CULTURES AGAIN --DO NOT PLACE PICC UNTIL WE HAVE PROVED CLEARNANCE OF HIS BACTEREMIA   I spent greater than 45 minutes with the patient including greater than 50% of time in face to face counsel of the patient and in coordination of their care.    LOS: 3 days   Acey Lav 05/27/2013, 2:52 PM

## 2013-05-28 ENCOUNTER — Encounter (HOSPITAL_COMMUNITY)
Admission: EM | Disposition: A | Discharge status: Home / Self Care | Payer: Managed Care, Other (non HMO) | Source: Home / Self Care | Attending: Internal Medicine

## 2013-05-28 ENCOUNTER — Encounter (HOSPITAL_COMMUNITY): Payer: Self-pay | Admitting: Gastroenterology

## 2013-05-28 DIAGNOSIS — I38 Endocarditis, valve unspecified: Secondary | ICD-10-CM

## 2013-05-28 DIAGNOSIS — A4189 Other specified sepsis: Secondary | ICD-10-CM

## 2013-05-28 HISTORY — PX: TEE WITHOUT CARDIOVERSION: SHX5443

## 2013-05-28 LAB — HEPATITIS PANEL, ACUTE: HCV Ab: NEGATIVE

## 2013-05-28 LAB — CULTURE, BLOOD (ROUTINE X 2)

## 2013-05-28 LAB — HEPATIC FUNCTION PANEL
ALT: 379 U/L — ABNORMAL HIGH (ref 0–53)
AST: 245 U/L — ABNORMAL HIGH (ref 0–37)
Albumin: 2.2 g/dL — ABNORMAL LOW (ref 3.5–5.2)
Alkaline Phosphatase: 401 U/L — ABNORMAL HIGH (ref 39–117)
Total Bilirubin: 4.6 mg/dL — ABNORMAL HIGH (ref 0.3–1.2)
Total Protein: 5.4 g/dL — ABNORMAL LOW (ref 6.0–8.3)

## 2013-05-28 LAB — BASIC METABOLIC PANEL
BUN: 11 mg/dL (ref 6–23)
Calcium: 8.5 mg/dL (ref 8.4–10.5)
Chloride: 100 mEq/L (ref 96–112)
GFR calc Af Amer: >90 mL/min (ref >90)
GFR calc non Af Amer: >90 mL/min (ref >90)
Glucose, Bld: 95 mg/dL (ref 70–99)
Sodium: 132 mEq/L — ABNORMAL LOW (ref 135–145)

## 2013-05-28 LAB — CBC
Hemoglobin: 11.9 g/dL — ABNORMAL LOW (ref 13.0–17.0)
MCH: 30.2 pg (ref 26.0–34.0)
MCHC: 34.2 g/dL (ref 30.0–36.0)
MCV: 88.3 fL (ref 78.0–100.0)
Platelets: 146 10*3/uL — ABNORMAL LOW (ref 150–400)
RBC: 3.94 MIL/uL — ABNORMAL LOW (ref 4.22–5.81)
RDW: 15.2 % (ref 11.5–15.5)

## 2013-05-28 LAB — CANCER ANTIGEN 19-9: CA 19-9: 33.8 U/mL — ABNORMAL LOW (ref <35.0)

## 2013-05-28 LAB — HIV ANTIBODY (ROUTINE TESTING W REFLEX): HIV: NONREACTIVE

## 2013-05-28 LAB — GENTAMICIN LEVEL, PEAK: Gentamicin Pk: 3.3 ug/mL — ABNORMAL LOW (ref 5.0–10.0)

## 2013-05-28 SURGERY — ECHOCARDIOGRAM, TRANSESOPHAGEAL
Anesthesia: Moderate Sedation

## 2013-05-28 MED ORDER — GI COCKTAIL ~~LOC~~
30.0000 mL | Freq: Once | ORAL | Status: AC
  Administered 2013-05-28: 21:00:00 30 mL via ORAL
  Filled 2013-05-28: qty 30

## 2013-05-28 MED ORDER — FENTANYL CITRATE 0.05 MG/ML IJ SOLN
INTRAMUSCULAR | Status: DC | PRN
  Administered 2013-05-28: 50 ug via INTRAVENOUS

## 2013-05-28 MED ORDER — HYDROMORPHONE HCL PF 1 MG/ML IJ SOLN
1.0000 mg | INTRAMUSCULAR | Status: DC | PRN
  Administered 2013-05-28 – 2013-05-30 (×13): 1 mg via INTRAVENOUS
  Filled 2013-05-28 (×14): qty 1

## 2013-05-28 MED ORDER — MIDAZOLAM HCL 10 MG/2ML IJ SOLN
INTRAMUSCULAR | Status: DC | PRN
  Administered 2013-05-28: 2 mg via INTRAVENOUS

## 2013-05-28 MED ORDER — PANTOPRAZOLE SODIUM 40 MG PO TBEC
40.0000 mg | DELAYED_RELEASE_TABLET | Freq: Every day | ORAL | Status: DC
  Administered 2013-05-28 – 2013-05-31 (×4): 40 mg via ORAL
  Filled 2013-05-28 (×5): qty 1

## 2013-05-28 NOTE — CV Procedure (Signed)
Procedure: TEE  Indication: Endocarditis  Sedation: Versed 3 mg IV, Fentanyl 50 mcg IV  Findings: Please see echo section of chart for full report.  Normal LV size and systolic function, EF 55-60%.  Normal RV size and systolic function.  Trivial MR.  No evidence for endocarditis.  Negative bubble study.   Conclusion: No evidence for endocarditis.   Marca Ancona 05/28/2013 12:00 PM

## 2013-05-28 NOTE — Progress Notes (Signed)
Echocardiogram Echocardiogram Transesophageal has been performed.  Dorothey Baseman 05/28/2013, 11:47 AM

## 2013-05-28 NOTE — OR Nursing (Signed)
carelink here to transport back to [facility] [address]

## 2013-05-28 NOTE — Progress Notes (Addendum)
[facility] PROGRESS NOTE  Terry Byrd:[id number] DOB: [birth year] DOA: 05/24/2013 PCP: [name], MD  Assessment/Plan: Enterococcus bacteremia  -currently on  vanc/gent per ID -2D ECHO with no vegetations reported,  TEE today to r/o endocarditis, suspicion low per [facility] cards -Secondary to acute cholangitis  -Repeat Blood cx from 10/21 pending  Acute cholangitis  -improving -Secondary to biliary stent migration and obstruction of CBD -s/p ERCP/dilation and stent exchange 10/20  -LFTs/bili appreciate GI input -advance diet after TEE  Uncontrolled pain  -Secondary to above -wean off PCA as tolerated  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 Wall thickness was increased in a pattern of mild LVH. Systolic function was vigorous. The estimated ejection fraction was in the range of 65% to 70%. Although no diagnostic regional wall motion abnormality was identified, this possibility cannot be completely excluded on the basis of this study. Left ventricular diastolic function parameters were normal.     ERCP- with STENT PLACEMENT per GI  Antibiotics:  vanc and imipenem started 10/18  HPI/Subjective: States abd pain about 2-3/10, NPO for TEE  Objective: Filed Vitals:   05/28/13 1220  BP: 174/109  Pulse: 82  Temp:   Resp: 15    Intake/Output Summary (Last 24 hours) at 05/28/13 1248 Last data filed at 05/28/13 0930  Gross per 24 hour  Intake 1441.87 ml  Output   4300 ml  Net -2858.13 ml   Filed Weights   05/24/13 2346  Weight: 95.573 kg (210 lb 11.2 oz)   Exam:  General: alert & oriented x 3 In NAD, jaundiced Cardiovascular: RRR, nl S1 s2 Respiratory: CTAB Abdomen: soft  +BS NT/ND, no masses palpable Extremities: No cyanosis and no edema    Data Reviewed: Basic Metabolic Panel:  Recent Labs Lab 05/24/13 1935 05/25/13 0510 05/26/13 0320 05/28/13 0240  NA 135 135 132* 132*  K 4.2 4.0 3.8 3.6  CL 101 101 98 100  CO2 23 21 27 26   GLUCOSE 125* 132* 136* 95  BUN 21 23 17 11   CREATININE 0.90 1.09 0.80 0.83  CALCIUM 8.8 9.0 8.6 8.5   Liver Function Tests:  Recent Labs Lab 05/26/13 0320 05/26/13 1409 05/27/13 0129 05/27/13 1434 05/28/13 0237  AST 357* 326* 243* 249* 245*  ALT 603* 562* 450* 413* 379*  ALKPHOS 295* 332* 352* 389* 401*  BILITOT 8.3* 8.1* 6.9* 5.5* 4.6*  PROT 5.4* 5.6* 5.2* 5.5* 5.4*  ALBUMIN 2.5* 2.4* 2.2* 2.2* 2.2*    Recent Labs Lab 05/24/13 1935 05/26/13 0320  LIPASE 66* 13  AMYLASE  --  36   No results found for this basename: AMMONIA,  in the last 168 hours CBC:  Recent Labs Lab 05/24/13 1935 05/25/13 0510 05/28/13 0240  WBC 10.0 15.0* 7.3  NEUTROABS 8.9* 13.6*  --   HGB 14.5 13.4 11.9*  HCT 43.0 39.9 34.8*  MCV 89.4 90.7 88.3  PLT 230 179 146*   Cardiac Enzymes: No results found for this basename: CKTOTAL, CKMB, CKMBINDEX, TROPONINI,  in the last 168 hours BNP (last 3 results) No results found for this basename: PROBNP,  in the last 8760 hours CBG: No results found for this basename: GLUCAP,  in the last 168  hours  Recent Results (from the past 240 hour(s))  CULTURE, BLOOD (ROUTINE X 2)     Status: None   Collection Time    05/24/13  9:45 PM      Result Value Range Status   Specimen Description BLOOD LEFT ANTECUBITAL   Final   Special Requests BOTTLES DRAWN AEROBIC AND ANAEROBIC 5CC   Final   Culture  Setup Time     Final   Value: 05/25/2013 01:21     Performed at [facility]   Culture     Final   Value: ENTEROCOCCUS SPECIES     Note: COMBINATION THERAPY OF HIGH DOSE AMPICILLIN OR VANCOMYCIN, PLUS AN AMINOGLYCOSIDE, IS USUALLY INDICATED FOR SERIOUS ENTEROCOCCAL INFECTIONS.      VIRIDANS STREPTOCOCCUS     Note: Gram Stain Report Called to,Read Back By and Verified With: JENNY BURNS 05/25/13 @ 12:40PM BY RUSCOE A.     Performed at [facility]   Report Status 05/28/2013 FINAL   Final   Organism ID, Bacteria ENTEROCOCCUS SPECIES   Final  CULTURE, BLOOD (ROUTINE X 2)     Status: None   Collection Time    05/24/13 10:00 PM      Result Value Range Status   Specimen Description BLOOD LEFT HAND   Final   Special Requests BOTTLES DRAWN AEROBIC AND ANAEROBIC 5CC   Final   Culture  Setup Time     Final   Value: 05/25/2013 01:20     Performed at [facility]   Culture     Final   Value: ENTEROCOCCUS SPECIES     Note: SUSCEPTIBILITIES PERFORMED ON PREVIOUS CULTURE WITHIN THE LAST 5 DAYS.     VIRIDANS STREPTOCOCCUS     Note: Susceptibilities performed on previous culture within the last 5 days.     Note: Gram Stain Report Called to,Read Back By and Verified With: JENNY BURNS 05/25/13 @ 12:40PM BY RUSCOE A.   Report Status 05/28/2013 FINAL   Final  CULTURE, BLOOD (ROUTINE X 2)     Status: None   Collection Time    05/26/13  3:27 PM      Result Value Range Status   Specimen Description BLOOD LEFT HAND   Final   Special Requests BOTTLES DRAWN AEROBIC AND ANAEROBIC 5CC   Final   Culture  Setup Time     Final   Value: 05/26/2013 20:42     Performed at [facility]   Culture     Final   Value:        BLOOD CULTURE RECEIVED NO GROWTH TO DATE CULTURE WILL BE HELD FOR 5 DAYS BEFORE ISSUING A FINAL NEGATIVE REPORT     Performed at [facility]   Report Status PENDING   Incomplete  SURGICAL PCR SCREEN     Status: Abnormal   Collection Time    05/26/13  3:30 PM      Result Value Range Status   MRSA, PCR INVALID RESULTS, SPECIMEN SENT FOR CULTURE (*) NEGATIVE Final   Comment: RCRV     A THOMPSON RN 1902 05/26/13 A NAVARRO   Staphylococcus aureus INVALID RESULTS, SPECIMEN SENT FOR CULTURE (*) NEGATIVE Final   Comment:            The Xpert SA  Assay (FDA     approved for NASAL specimens     in patients over 64 years of age),     is one component of  a comprehensive surveillance     program.  Test performance has     been validated by [facility] for patients greater     than or equal to 48 year old.     It is not intended     to diagnose infection nor to     guide or monitor treatment.  CULTURE, BLOOD (ROUTINE X 2)     Status: None   Collection Time    05/26/13  3:34 PM      Result Value Range Status   Specimen Description BLOOD RIGHT HAND   Final   Special Requests BOTTLES DRAWN AEROBIC ONLY 3CC   Final   Culture  Setup Time     Final   Value: 05/26/2013 20:39     Performed at [facility]   Culture     Final   Value: ENTEROCOCCUS SPECIES     Note: SUSCEPTIBILITIES PERFORMED ON PREVIOUS CULTURE WITHIN THE LAST 5 DAYS.     Note: Gram Stain Report Called to,Read Back By and Verified With: ANNIE H @ 1400 05/27/13 BY KRAWS     Performed at [facility]   Report Status 05/28/2013 FINAL   Final  MRSA CULTURE     Status: None   Collection Time    05/26/13  7:00 PM      Result Value Range Status   Specimen Description NOSE   Final   Special Requests NONE   Final   Culture     Final   Value: NO SUSPICIOUS COLONIES, CONTINUING TO HOLD     Performed at [facility]   Report Status PENDING   Incomplete  CULTURE, BLOOD (ROUTINE X 2)     Status: None   Collection Time    05/27/13  4:00 PM      Result Value Range Status   Specimen Description BLOOD LEFT HAND   Final   Special Requests BOTTLES DRAWN AEROBIC AND ANAEROBIC 10CC   Final   Culture  Setup Time     Final   Value: 05/27/2013 21:01     Performed at [facility]   Culture     Final   Value:        BLOOD CULTURE RECEIVED NO GROWTH TO DATE CULTURE WILL BE HELD FOR 5 DAYS BEFORE ISSUING A FINAL NEGATIVE REPORT     Performed at [facility]   Report Status PENDING   Incomplete  CULTURE, BLOOD (ROUTINE X 2)     Status:  None   Collection Time    05/27/13  4:15 PM      Result Value Range Status   Specimen Description BLOOD LEFT ARM   Final   Special Requests BOTTLES DRAWN AEROBIC AND ANAEROBIC 10CC   Final   Culture  Setup Time     Final   Value: 05/27/2013 21:01     Performed at [facility]   Culture     Final   Value:        BLOOD CULTURE RECEIVED NO GROWTH TO DATE CULTURE WILL BE HELD FOR 5 DAYS BEFORE ISSUING A FINAL NEGATIVE REPORT     Performed at [facility]   Report Status PENDING   Incomplete     Studies: Dg Ercp  05/26/2013   CLINICAL DATA:  Cholangitis, common duct stent placement  EXAM: ERCP  TECHNIQUE: Multiple spot images obtained with the fluoroscopic device and submitted for interpretation post-procedure.  COMPARISON:  Prior CT 05/24/2013 and 05/23/2013, abdominal ultrasound 05/21/2013  FINDINGS: On initial injection, there is transient ovoid filling defect at the mid portion of the dilated common duct, see for example image labeled 4. This could be due to flow phenomenon although the presence of stones, sludge, or other material could also account for this appearance. There is no focal caliber abnormality of the common duct. A plastic stent was placed. Contrast is not seen within the duodenum. Mild intrahepatic ductal dilatation is also identified.  IMPRESSION: Early appearance of mid common duct filling defect which could represent inflow phenomenon although a stone, sludge, or other material could also account for this appearance.  Common and intrahepatic ductal dilatation reidentified, with placement of a plastic stent.  These images were submitted for radiologic interpretation only. Please see the procedural report for the amount of contrast and the fluoroscopy time utilized.   Electronically Signed   By: Christiana Pellant M.D.   On: 05/26/2013 17:55    Scheduled Meds: . diphenhydrAMINE  12.5 mg Intravenous Once  . enoxaparin (LOVENOX) injection  40 mg Subcutaneous Q24H  .  gentamicin  80 mg Intravenous Q8H  . HYDROmorphone PCA 0.3 mg/mL   Intravenous Q4H  . imipenem-cilastatin  500 mg Intravenous Q6H  . sodium chloride  3 mL Intravenous Q12H   Continuous Infusions: . sodium chloride 100 mL/hr (05/27/13 1545)  . sodium chloride 20 mL/hr (05/27/13 1730)    Active Problems:   Pancreatic mass   Obstruction of bile duct   Sepsis   Acute cholangitis   Transaminasemia    Time spent:    [facility]  [facility] Pager [phone]. If 7PM-7AM, please contact night-coverage at [email or web address], password [facility] 05/28/2013, 12:48 PM  LOS: 4 days

## 2013-05-28 NOTE — Interval H&P Note (Signed)
History and Physical Interval Note:  05/28/2013 11:41 AM  Terry Byrd  has presented today for surgery, with the diagnosis of endocarditis  The various methods of treatment have been discussed with the patient and family. After consideration of risks, benefits and other options for treatment, the patient has consented to  Procedure(s): TRANSESOPHAGEAL ECHOCARDIOGRAM (TEE) (N/A) as a surgical intervention .  The patient's history has been reviewed, patient examined, no change in status, stable for surgery.  I have reviewed the patient's chart and labs.  Questions were answered to the patient's satisfaction.     Dalton [employer]

## 2013-05-28 NOTE — H&P (View-Only) (Signed)
  [facility]  Day #4 imipenem Day # 4 vancomycin   Subjective: Sleepy this afternoon   Antibiotics:  Anti-infectives   Start     Dose/Rate Route Frequency Ordered Stop   05/27/13 0800  vancomycin (VANCOCIN) 1,250 mg in sodium chloride 0.9 % 250 mL IVPB     1,250 mg 166.7 mL/hr over 90 Minutes Intravenous Every 8 hours 05/27/13 0340     05/25/13 1200  imipenem-cilastatin (PRIMAXIN) 500 mg in sodium chloride 0.9 % 100 mL IVPB     500 mg 200 mL/hr over 30 Minutes Intravenous 4 times per day 05/25/13 0854     05/25/13 0030  vancomycin (VANCOCIN) IVPB 1000 mg/200 mL premix  Status:  Discontinued     1,000 mg 200 mL/hr over 60 Minutes Intravenous Every 8 hours 05/25/13 0000 05/27/13 0340   05/24/13 2359  imipenem-cilastatin (PRIMAXIN) 500 mg in sodium chloride 0.9 % 100 mL IVPB  Status:  Discontinued     500 mg 200 mL/hr over 30 Minutes Intravenous Every 8 hours 05/24/13 2334 05/25/13 0854   05/24/13 1930  ciprofloxacin (CIPRO) IVPB 400 mg     400 mg 200 mL/hr over 60 Minutes Intravenous  Once 05/24/13 1905 05/24/13 2137   05/24/13 1915  metroNIDAZOLE (FLAGYL) IVPB 500 mg     500 mg 100 mL/hr over 60 Minutes Intravenous  Once 05/24/13 1905 05/24/13 2254      Medications: Scheduled Meds: . diphenhydrAMINE  12.5 mg Intravenous Once  . enoxaparin (LOVENOX) injection  40 mg Subcutaneous Q24H  . HYDROmorphone PCA 0.3 mg/mL   Intravenous Q4H  . imipenem-cilastatin  500 mg Intravenous Q6H  . sodium chloride  3 mL Intravenous Q12H  . vancomycin  1,250 mg Intravenous Q8H   Continuous Infusions: . sodium chloride 100 mL/hr at 05/27/13 0211   PRN Meds:.calcium carbonate, hydrOXYzine, ondansetron (ZOFRAN) IV, ondansetron   Objective: Weight change:   Intake/Output Summary (Last 24 hours) at 05/27/13 1452 Last data filed at 05/27/13 1149  Gross per 24 hour  Intake 4098.53 ml  Output   3125 ml  Net 973.53 ml   Blood pressure 130/79, pulse 78,  temperature 98.3 F (36.8 C), temperature source Oral, resp. rate 16, height 5' 10" (1.778 m), weight 210 lb 11.2 oz (95.573 kg), SpO2 93.00%. Temp:  [97.4 F (36.3 C)-98.8 F (37.1 C)] 98.3 F (36.8 C) (10/21 0830) Pulse Rate:  [78-97] 78 (10/21 0500) Resp:  [10-18] 16 (10/21 1144) BP: (115-130)/(73-90) 130/79 mmHg (10/21 0500) SpO2:  [93 %-100 %] 93 % (10/21 1144)  Physical Exam: General: Alert and awake, oriented x3, not in any acute distress, jaundiced  HEENT: icteric sclera,EOMI, oropharynx clear and without exudate  CVS tachy rate, normal r, no murmur rubs or gallops  Chest: clear to auscultation bilaterally, no wheezing, rales or rhonchi  Abdomen: soft TTP RUQ, nondistended, normal bowel sounds,  Extremities: no clubbing or edema noted bilaterally  Skin: no rashes  Neuro: nonfocal, strength and sensation intact  Lab Results:BMET  Micro Results: Recent Results (from the past 240 hour(s))  CULTURE, BLOOD (ROUTINE X 2)     Status: None   Collection Time    05/24/13  9:45 PM      Result Value Range Status   Specimen Description BLOOD LEFT ANTECUBITAL   Final   Special Requests BOTTLES DRAWN AEROBIC AND ANAEROBIC 5CC   Final   Culture  Setup Time     Final   Value: 05/25/2013 01:21       Performed at [facility]   Culture     Final   Value: ENTEROCOCCUS SPECIES     Note: Gram Stain Report Called to,Read Back By and Verified With: JENNY BURNS 05/25/13 @ 12:40PM BY RUSCOE A.     Performed at [facility]   Report Status PENDING   Incomplete  CULTURE, BLOOD (ROUTINE X 2)     Status: None   Collection Time    05/24/13 10:00 PM      Result Value Range Status   Specimen Description BLOOD LEFT HAND   Final   Special Requests BOTTLES DRAWN AEROBIC AND ANAEROBIC 5CC   Final   Culture  Setup Time     Final   Value: 05/25/2013 01:20     Performed at [facility]   Culture     Final   Value: ENTEROCOCCUS SPECIES     Note: Gram Stain Report Called to,Read  Back By and Verified With: JENNY BURNS 05/25/13 @ 12:40PM BY RUSCOE A.     Performed at [facility]   Report Status PENDING   Incomplete  CULTURE, BLOOD (ROUTINE X 2)     Status: None   Collection Time    05/26/13  3:27 PM      Result Value Range Status   Specimen Description BLOOD LEFT HAND   Final   Special Requests BOTTLES DRAWN AEROBIC AND ANAEROBIC 5CC   Final   Culture  Setup Time     Final   Value: 05/26/2013 20:42     Performed at [facility]   Culture     Final   Value:        BLOOD CULTURE RECEIVED NO GROWTH TO DATE CULTURE WILL BE HELD FOR 5 DAYS BEFORE ISSUING A FINAL NEGATIVE REPORT     Performed at [facility]   Report Status PENDING   Incomplete  SURGICAL PCR SCREEN     Status: Abnormal   Collection Time    05/26/13  3:30 PM      Result Value Range Status   MRSA, PCR INVALID RESULTS, SPECIMEN SENT FOR CULTURE (*) NEGATIVE Final   Comment: RCRV     A THOMPSON RN 1902 05/26/13 A NAVARRO   Staphylococcus aureus INVALID RESULTS, SPECIMEN SENT FOR CULTURE (*) NEGATIVE Final   Comment:            The Xpert SA Assay (FDA     approved for NASAL specimens     in patients over 21 years of age),     is one component of     a comprehensive surveillance     program.  Test performance has     been validated by [facility] for patients greater     than or equal to 1 year old.     It is not intended     to diagnose infection nor to     guide or monitor treatment.  CULTURE, BLOOD (ROUTINE X 2)     Status: None   Collection Time    05/26/13  3:34 PM      Result Value Range Status   Specimen Description BLOOD RIGHT HAND   Final   Special Requests BOTTLES DRAWN AEROBIC ONLY 3CC   Final   Culture  Setup Time     Final   Value: 05/26/2013 20:39     Performed at [facility]   Culture     Final     Value: GRAM POSITIVE COCCI IN PAIRS AND CHAINS     Note: Gram Stain Report Called to,Read Back By and Verified With: ANNIE H @ 1400 05/27/13 BY  KRAWS     Performed at [facility]   Report Status PENDING   Incomplete    Studies/Results: Dg Ercp  05/26/2013   CLINICAL DATA:  Cholangitis, common duct stent placement  EXAM: ERCP  TECHNIQUE: Multiple spot images obtained with the fluoroscopic device and submitted for interpretation post-procedure.  COMPARISON:  Prior CT 05/24/2013 and 05/23/2013, abdominal ultrasound 05/21/2013  FINDINGS: On initial injection, there is transient ovoid filling defect at the mid portion of the dilated common duct, see for example image labeled 4. This could be due to flow phenomenon although the presence of stones, sludge, or other material could also account for this appearance. There is no focal caliber abnormality of the common duct. A plastic stent was placed. Contrast is not seen within the duodenum. Mild intrahepatic ductal dilatation is also identified.  IMPRESSION: Early appearance of mid common duct filling defect which could represent inflow phenomenon although a stone, sludge, or other material could also account for this appearance.  Common and intrahepatic ductal dilatation reidentified, with placement of a plastic stent.  These images were submitted for radiologic interpretation only. Please see the procedural report for the amount of contrast and the fluoroscopy time utilized.   Electronically Signed   By: Gretchen  Green M.D.   On: 05/26/2013 17:55      Assessment/Plan: Quartez B Runkel is a 57 y.o. 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  CULTURES ARE PERSISTENTLY POSITIVE FOR ENTEROCOCCUS GROWING FROM CULTURE YESTERDAY  He then has TWO MAJOR criteria for endocarditis with typical organism and persistently positive cultures.  I DO THINK He needs TEE and have called Trish with [facility]  cardiology and pt is already scheduled for tomorrow.  I will add gentamicin    --REPEAT BLOOD CULTURES AGAIN --DO NOT PLACE PICC UNTIL WE HAVE PROVED CLEARNANCE OF HIS BACTEREMIA   I spent greater than 45 minutes with the patient including greater than 50% of time in face to face counsel of the patient and in coordination of their care.    LOS: 3 days   Cornelius Van Dam 05/27/2013, 2:52 PM   

## 2013-05-28 NOTE — Progress Notes (Signed)
Encouraging pt again today to get out of bed to chair, or short ambulation about room or hallway; he is refusing: states "my kids are coming and I don't want to get up". Pt did ambulate to BR and passed flatus. Call to Dr. Jomarie Longs with update.   Pt did agree to discontinuing PCA, and begin PRN pain med regimen.

## 2013-05-28 NOTE — Progress Notes (Signed)
Pt O2, telemetry and PCA discontinued per MD order

## 2013-05-28 NOTE — Progress Notes (Signed)
Page to MD regarding order for Protonix as pt is c/o belching V8 Juice-he has full liquid diet now, and is consuming well, other than current indigestion like symptoms.   Doing well with adjustments in pain medication.

## 2013-05-28 NOTE — Progress Notes (Signed)
    Progress Note   Subjective  Improved, wants more food   Objective  Vital signs in last 24 hours: Temp:  [98.2 F (36.8 C)-98.4 F (36.9 C)] 98.2 F (36.8 C) (10/22 0555) Pulse Rate:  [81-87] 87 (10/22 0555) Resp:  [12-18] 18 (10/22 0832) BP: (116-138)/(78-83) 116/78 mmHg (10/22 0555) SpO2:  [93 %-98 %] 97 % (10/22 0832) Last BM Date: 05/22/13 General:   Alert, well-developed, jaundiced male in NAD Heart:  Regular rate and rhythm; no murmurs Abdomen:  Soft, nontender and nondistended. Normal bowel sounds, without guarding, and without rebound.   Extremities:  Without edema. Neurologic:  Alert and  oriented x4;  grossly normal neurologically. Psych:  Alert and cooperative. Normal mood and affect.  Intake/Output from previous day: 10/21 0701 - 10/22 0700 In: 1561.9 [P.O.:120; I.V.:941.9; IV Piggyback:500] Out: 4900 [Urine:4900] Intake/Output this shift: Total I/O In: 3 [I.V.:3] Out: -   Lab Results:  Recent Labs  05/28/13 0240  WBC 7.3  HGB 11.9*  HCT 34.8*  PLT 146*   BMET  Recent Labs  05/26/13 0320 05/28/13 0240  NA 132* 132*  K 3.8 3.6  CL 98 100  CO2 27 26  GLUCOSE 136* 95  BUN 17 11  CREATININE 0.80 0.83  CALCIUM 8.6 8.5   LFT  Recent Labs  05/28/13 0237  PROT 5.4*  ALBUMIN 2.2*  AST 245*  ALT 379*  ALKPHOS 401*  BILITOT 4.6*  BILIDIR 3.4*  IBILI 1.2*   PT/INR No results found for this basename: LABPROT, INR,  in the last 72 hours Hepatitis Panel  Recent Labs  05/27/13 0129  HEPBSAG NEGATIVE  HCVAB NEGATIVE  HEPAIGM NON REACTIVE  HEPBIGM PENDING    Studies/Results: Dg Ercp  05/26/2013   CLINICAL DATA:  Cholangitis, common duct stent placement  EXAM: ERCP  TECHNIQUE: Multiple spot images obtained with the fluoroscopic device and submitted for interpretation post-procedure.  COMPARISON:  Prior CT 05/24/2013 and 05/23/2013, abdominal ultrasound 05/21/2013  FINDINGS: On initial injection, there is transient ovoid filling  defect at the mid portion of the dilated common duct, see for example image labeled 4. This could be due to flow phenomenon although the presence of stones, sludge, or other material could also account for this appearance. There is no focal caliber abnormality of the common duct. A plastic stent was placed. Contrast is not seen within the duodenum. Mild intrahepatic ductal dilatation is also identified.  IMPRESSION: Early appearance of mid common duct filling defect which could represent inflow phenomenon although a stone, sludge, or other material could also account for this appearance.  Common and intrahepatic ductal dilatation reidentified, with placement of a plastic stent.  These images were submitted for radiologic interpretation only. Please see the procedural report for the amount of contrast and the fluoroscopy time utilized.   Electronically Signed   By: Christiana Pellant M.D.   On: 05/26/2013 17:55     Assessment & Plan   1. 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 R/O CBD stricture from chronic pancreatitis or pancreatic neoplasm. ERCP with biliary stent exchange on Monday. Transaminases and t bili have again improved. Further evaluation of possible pancreatic lesion as outpatient after cholangitis has been fully treated. Advance diet later today.  2. Acute cholangitis, imporved. Enterococcus in blood culture secondary to cholangitis. Continue antibiotics per ID recommendations. TEE today.     LOS: 4 days   Judie Petit T. Russella Dar MD [facility] 05/28/2013, 10:04 AM

## 2013-05-28 NOTE — Progress Notes (Signed)
[facility]  Day #5 imipenem Day # 2 gentamicin   Subjective: Feels better  Antibiotics:  Anti-infectives   Start     Dose/Rate Route Frequency Ordered Stop   05/27/13 1600  gentamicin (GARAMYCIN) IVPB 80 mg     80 mg 100 mL/hr over 30 Minutes Intravenous Every 8 hours 05/27/13 1538     05/27/13 0800  vancomycin (VANCOCIN) 1,250 mg in sodium chloride 0.9 % 250 mL IVPB  Status:  Discontinued     1,250 mg 166.7 mL/hr over 90 Minutes Intravenous Every 8 hours 05/27/13 0340 05/28/13 0948   05/25/13 1200  imipenem-cilastatin (PRIMAXIN) 500 mg in sodium chloride 0.9 % 100 mL IVPB     500 mg 200 mL/hr over 30 Minutes Intravenous 4 times per day 05/25/13 0854     05/25/13 0030  vancomycin (VANCOCIN) IVPB 1000 mg/200 mL premix  Status:  Discontinued     1,000 mg 200 mL/hr over 60 Minutes Intravenous Every 8 hours 05/25/13 0000 05/27/13 0340   05/24/13 2359  imipenem-cilastatin (PRIMAXIN) 500 mg in sodium chloride 0.9 % 100 mL IVPB  Status:  Discontinued     500 mg 200 mL/hr over 30 Minutes Intravenous Every 8 hours 05/24/13 2334 05/25/13 0854   05/24/13 1930  ciprofloxacin (CIPRO) IVPB 400 mg     400 mg 200 mL/hr over 60 Minutes Intravenous  Once 05/24/13 1905 05/24/13 2137   05/24/13 1915  metroNIDAZOLE (FLAGYL) IVPB 500 mg     500 mg 100 mL/hr over 60 Minutes Intravenous  Once 05/24/13 1905 05/24/13 2254      Medications: Scheduled Meds: . diphenhydrAMINE  12.5 mg Intravenous Once  . enoxaparin (LOVENOX) injection  40 mg Subcutaneous Q24H  . gentamicin  80 mg Intravenous Q8H  . HYDROmorphone PCA 0.3 mg/mL   Intravenous Q4H  . imipenem-cilastatin  500 mg Intravenous Q6H  . sodium chloride  3 mL Intravenous Q12H   Continuous Infusions: . sodium chloride 75 mL/hr (05/28/13 1427)  . sodium chloride 20 mL/hr (05/27/13 1730)   PRN Meds:.calcium carbonate, hydrOXYzine, ondansetron (ZOFRAN) IV, ondansetron   Objective: Weight change:    Intake/Output Summary (Last 24 hours) at 05/28/13 1551 Last data filed at 05/28/13 1300  Gross per 24 hour  Intake  550.2 ml  Output   4100 ml  Net -3549.8 ml   Blood pressure 139/90, pulse 81, temperature 98.2 F (36.8 C), temperature source Oral, resp. rate 14, height 5\' 10"  (1.778 m), weight 210 lb 11.2 oz (95.573 kg), SpO2 97.00%. Temp:  [98.2 F (36.8 C)-98.5 F (36.9 C)] 98.2 F (36.8 C) (10/22 1407) Pulse Rate:  [79-88] 81 (10/22 1407) Resp:  [11-18] 14 (10/22 1407) BP: (116-196)/(78-119) 139/90 mmHg (10/22 1407) SpO2:  [92 %-99 %] 97 % (10/22 1407)  Physical Exam: General: Alert and awake, oriented x3, not in any acute distress, jaundiced  HEENT: less  icteric sclera,EOMI, oropharynx clear and without exudate  CVS tachy rate, normal r, no murmur rubs or gallops  Chest: clear to auscultation bilaterally, no wheezing, rales or rhonchi  Abdomen: soft TTP RUQ, nondistended, normal bowel sounds,  Extremities: no clubbing or edema noted bilaterally  Skin: no rashes  Neuro: nonfocal, strength and sensation intact  Lab Results:BMET  Micro Results: Recent Results (from the past 240 hour(s))  CULTURE, BLOOD (ROUTINE X 2)     Status: None   Collection Time    05/24/13  9:45 PM      Result Value Range Status  Specimen Description BLOOD LEFT ANTECUBITAL   Final   Special Requests BOTTLES DRAWN AEROBIC AND ANAEROBIC 5CC   Final   Culture  Setup Time     Final   Value: 05/25/2013 01:21     Performed at [facility]   Culture     Final   Value: ENTEROCOCCUS SPECIES     Note: COMBINATION THERAPY OF HIGH DOSE AMPICILLIN OR VANCOMYCIN, PLUS AN AMINOGLYCOSIDE, IS USUALLY INDICATED FOR SERIOUS ENTEROCOCCAL INFECTIONS.     VIRIDANS STREPTOCOCCUS     Note: Gram Stain Report Called to,Read Back By and Verified With: JENNY BURNS 05/25/13 @ 12:40PM BY RUSCOE A.     Performed at [facility]   Report Status 05/28/2013 FINAL   Final   Organism ID, Bacteria  ENTEROCOCCUS SPECIES   Final  CULTURE, BLOOD (ROUTINE X 2)     Status: None   Collection Time    05/24/13 10:00 PM      Result Value Range Status   Specimen Description BLOOD LEFT HAND   Final   Special Requests BOTTLES DRAWN AEROBIC AND ANAEROBIC 5CC   Final   Culture  Setup Time     Final   Value: 05/25/2013 01:20     Performed at [facility]   Culture     Final   Value: ENTEROCOCCUS SPECIES     Note: SUSCEPTIBILITIES PERFORMED ON PREVIOUS CULTURE WITHIN THE LAST 5 DAYS.     VIRIDANS STREPTOCOCCUS     Note: Susceptibilities performed on previous culture within the last 5 days.     Note: Gram Stain Report Called to,Read Back By and Verified With: JENNY BURNS 05/25/13 @ 12:40PM BY RUSCOE A.   Report Status 05/28/2013 FINAL   Final  CULTURE, BLOOD (ROUTINE X 2)     Status: None   Collection Time    05/26/13  3:27 PM      Result Value Range Status   Specimen Description BLOOD LEFT HAND   Final   Special Requests BOTTLES DRAWN AEROBIC AND ANAEROBIC 5CC   Final   Culture  Setup Time     Final   Value: 05/26/2013 20:42     Performed at [facility]   Culture     Final   Value:        BLOOD CULTURE RECEIVED NO GROWTH TO DATE CULTURE WILL BE HELD FOR 5 DAYS BEFORE ISSUING A FINAL NEGATIVE REPORT     Performed at [facility]   Report Status PENDING   Incomplete  SURGICAL PCR SCREEN     Status: Abnormal   Collection Time    05/26/13  3:30 PM      Result Value Range Status   MRSA, PCR INVALID RESULTS, SPECIMEN SENT FOR CULTURE (*) NEGATIVE Final   Comment: RCRV     A THOMPSON RN 1902 05/26/13 A NAVARRO   Staphylococcus aureus INVALID RESULTS, SPECIMEN SENT FOR CULTURE (*) NEGATIVE Final   Comment:            The Xpert SA Assay (FDA     approved for NASAL specimens     in patients over 77 years of age),     is one component of     a comprehensive surveillance     program.  Test performance has     been validated by [facility] for patients greater      than or equal to 73 year old.  It is not intended     to diagnose infection nor to     guide or monitor treatment.  CULTURE, BLOOD (ROUTINE X 2)     Status: None   Collection Time    05/26/13  3:34 PM      Result Value Range Status   Specimen Description BLOOD RIGHT HAND   Final   Special Requests BOTTLES DRAWN AEROBIC ONLY 3CC   Final   Culture  Setup Time     Final   Value: 05/26/2013 20:39     Performed at [facility]   Culture     Final   Value: ENTEROCOCCUS SPECIES     Note: SUSCEPTIBILITIES PERFORMED ON PREVIOUS CULTURE WITHIN THE LAST 5 DAYS.     Note: Gram Stain Report Called to,Read Back By and Verified With: ANNIE H @ 1400 05/27/13 BY KRAWS     Performed at [facility]   Report Status 05/28/2013 FINAL   Final  MRSA CULTURE     Status: None   Collection Time    05/26/13  7:00 PM      Result Value Range Status   Specimen Description NOSE   Final   Special Requests NONE   Final   Culture     Final   Value: NO SUSPICIOUS COLONIES, CONTINUING TO HOLD     Performed at [facility]   Report Status PENDING   Incomplete  CULTURE, BLOOD (ROUTINE X 2)     Status: None   Collection Time    05/27/13  4:00 PM      Result Value Range Status   Specimen Description BLOOD LEFT HAND   Final   Special Requests BOTTLES DRAWN AEROBIC AND ANAEROBIC 10CC   Final   Culture  Setup Time     Final   Value: 05/27/2013 21:01     Performed at [facility]   Culture     Final   Value:        BLOOD CULTURE RECEIVED NO GROWTH TO DATE CULTURE WILL BE HELD FOR 5 DAYS BEFORE ISSUING A FINAL NEGATIVE REPORT     Performed at [facility]   Report Status PENDING   Incomplete  CULTURE, BLOOD (ROUTINE X 2)     Status: None   Collection Time    05/27/13  4:15 PM      Result Value Range Status   Specimen Description BLOOD LEFT ARM   Final   Special Requests BOTTLES DRAWN AEROBIC AND ANAEROBIC 10CC   Final   Culture  Setup Time     Final   Value: 05/27/2013  21:01     Performed at [facility]   Culture     Final   Value:        BLOOD CULTURE RECEIVED NO GROWTH TO DATE CULTURE WILL BE HELD FOR 5 DAYS BEFORE ISSUING A FINAL NEGATIVE REPORT     Performed at [facility]   Report Status PENDING   Incomplete    Studies/Results: Dg Ercp  05/26/2013   CLINICAL DATA:  Cholangitis, common duct stent placement  EXAM: ERCP  TECHNIQUE: Multiple spot images obtained with the fluoroscopic device and submitted for interpretation post-procedure.  COMPARISON:  Prior CT 05/24/2013 and 05/23/2013, abdominal ultrasound 05/21/2013  FINDINGS: On initial injection, there is transient ovoid filling defect at the mid portion of the dilated common duct, see for example image labeled 4. This could be due to flow phenomenon although the  presence of stones, sludge, or other material could also account for this appearance. There is no focal caliber abnormality of the common duct. A plastic stent was placed. Contrast is not seen within the duodenum. Mild intrahepatic ductal dilatation is also identified.  IMPRESSION: Early appearance of mid common duct filling defect which could represent inflow phenomenon although a stone, sludge, or other material could also account for this appearance.  Common and intrahepatic ductal dilatation reidentified, with placement of a plastic stent.  These images were submitted for radiologic interpretation only. Please see the procedural report for the amount of contrast and the fluoroscopy time utilized.   Electronically Signed   By: Christiana Pellant M.D.   On: 05/26/2013 17:55      Assessment/Plan: Terry Byrd is a 57 y.o. 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 TEE was negative for vegetations.   #1 ENterococcal bacteremia : undoubtedly due to his cholangitis. While his TEE was negative his  persistently positive cultures with organism typical for endocarditis make me concerned. I feel VERY strongly that he should AT MINIMUm be treated for two weeks with IMIPENEM and GENTAMICIN from date of first negative blood culture and that we should consider extending this to 4 weeks for an "endocarditis" regimen. Regardless we are going to want to get surveillance blood cultures 2 weeks post stopping abx  --DO NOT PLACE PICC UNTIL WE HAVE PROVED CLEARNANCE OF HIS BACTEREMIA  --He will need weekly cbc bmp as well as gentamicin levels faxed to me at [phone] but I will need his infusion co pharmacist to be dosing his gentamicin     LOS: 4 days   Acey Lav 05/28/2013, 3:51 PM

## 2013-05-29 ENCOUNTER — Encounter (HOSPITAL_COMMUNITY): Payer: Self-pay | Admitting: Cardiology

## 2013-05-29 ENCOUNTER — Telehealth: Payer: Self-pay

## 2013-05-29 LAB — COMPREHENSIVE METABOLIC PANEL
ALT: 331 U/L — ABNORMAL HIGH (ref 0–53)
AST: 192 U/L — ABNORMAL HIGH (ref 0–37)
Albumin: 2.4 g/dL — ABNORMAL LOW (ref 3.5–5.2)
Alkaline Phosphatase: 457 U/L — ABNORMAL HIGH (ref 39–117)
CO2: 24 mEq/L (ref 19–32)
Calcium: 8.9 mg/dL (ref 8.4–10.5)
Chloride: 100 mEq/L (ref 96–112)
Creatinine, Ser: 0.82 mg/dL (ref 0.50–1.35)
GFR calc Af Amer: >90 mL/min (ref >90)
GFR calc non Af Amer: >90 mL/min (ref >90)
Potassium: 3.5 mEq/L (ref 3.5–5.1)
Sodium: 134 mEq/L — ABNORMAL LOW (ref 135–145)
Total Protein: 5.8 g/dL — ABNORMAL LOW (ref 6.0–8.3)

## 2013-05-29 LAB — MRSA CULTURE

## 2013-05-29 LAB — CBC
Hemoglobin: 12.4 g/dL — ABNORMAL LOW (ref 13.0–17.0)
MCH: 29.5 pg (ref 26.0–34.0)
MCHC: 34.1 g/dL (ref 30.0–36.0)
MCV: 86.7 fL (ref 78.0–100.0)
Platelets: 189 10*3/uL (ref 150–400)
RDW: 15 % (ref 11.5–15.5)

## 2013-05-29 LAB — GENTAMICIN LEVEL, TROUGH: Gentamicin Trough: 0.4 ug/mL — ABNORMAL LOW (ref 0.5–2.0)

## 2013-05-29 MED ORDER — HYDROCODONE-ACETAMINOPHEN 5-325 MG PO TABS
2.0000 | ORAL_TABLET | ORAL | Status: DC | PRN
  Administered 2013-05-29: 2 via ORAL
  Administered 2013-05-29: 1 via ORAL
  Administered 2013-05-29 – 2013-05-31 (×7): 2 via ORAL
  Filled 2013-05-29 (×4): qty 2
  Filled 2013-05-29: qty 1
  Filled 2013-05-29 (×4): qty 2

## 2013-05-29 MED ORDER — HYDROCODONE-ACETAMINOPHEN 5-325 MG PO TABS
1.0000 | ORAL_TABLET | ORAL | Status: DC | PRN
  Administered 2013-05-29: 1 via ORAL
  Filled 2013-05-29: qty 1

## 2013-05-29 NOTE — Progress Notes (Signed)
Rx Brief Abx note:  IV Gentamicin  Assessement:  Peak= 3.3 and Trough=0.4  Within goal   Scr stable  Plan:  Continue Gentamicin @ 80mg  IV q8h  F/U SCr/additional levels as needed  Lorenza Evangelist 05/29/2013 2:34 AM

## 2013-05-29 NOTE — Progress Notes (Signed)
Pt's wife requested for the RN to page Anne Shutter.  Call placed to after hours answering service who states that Lucrezia Europe was not on call and at the request of the pt's wife Dr. Arlyce Dice was paged.  Dr. Arlyce Dice returned the page and stated he was not familiar with this patient and would not the best source of information regarding the case.  Dr. Marzetta Board suggestion was for the wife to speak with Gastroenterology in the morning.  Information was relayed to the pt and pt's wife who was understanding and agreeable to speak with the rounding physicians in the morning. Maeola Harman

## 2013-05-29 NOTE — Progress Notes (Signed)
Terry Byrd   Subjective  Having abdominal discomfort since weaning from PCA   Objective   Vital signs in last 24 hours: Temp:  [98.2 F (36.8 C)-98.9 F (37.2 C)] 98.9 F (37.2 C) (10/23 0553) Pulse Rate:  [79-88] 83 (10/23 0553) Resp:  [11-16] 12 (10/23 0553) BP: (128-196)/(83-119) 137/83 mmHg (10/23 0553) SpO2:  [92 %-99 %] 96 % (10/23 0553) Last BM Date: 05/22/13 General:    white male in NAD Heart:  Regular rate and rhythm Lungs: Respirations even and unlabored, lungs CTA bilaterally Abdomen:  Soft, nontender and nondistended. Normal bowel sounds. Extremities:  Without edema. Neurologic:  Alert and oriented,  grossly normal neurologically. Psych:  Cooperative. Normal mood and affect. Lab Results:  Recent Labs  05/28/13 0240 05/29/13 0546  WBC 7.3 7.3  HGB 11.9* 12.4*  HCT 34.8* 36.4*  PLT 146* 189   BMET  Recent Labs  05/28/13 0240 05/29/13 0546  NA 132* 134*  K 3.6 3.5  CL 100 100  CO2 26 24  GLUCOSE 95 98  BUN 11 10  CREATININE 0.83 0.82  CALCIUM 8.5 8.9   LFT  Recent Labs  05/28/13 0237 05/29/13 0546  PROT 5.4* 5.8*  ALBUMIN 2.2* 2.4*  AST 245* 192*  ALT 379* 331*  ALKPHOS 401* 457*  BILITOT 4.6* 3.7*  BILIDIR 3.4*  --   IBILI 1.2*  --      Assessment / Plan:    1. Biliary obstruction, s/p ERCP with biliary stent exchange 10/20. LFTs improving. Etiology of stricture not clear but possibly secondary to chronic pancreatitis. Malignancy not excluded with certainty. FNA shows atypical cells. CA19-9 is normal. Patient to be referred to pancreatic surgeon at [facility] for consideration of further evaluation and possible need for [place]. Diet has been advanced. Hospitalist transitioning him to oral pain meds.   2. Acute cholangitis. Enterococcus bacteremia likely secondary to #1. TEE negative for endocarditis. ID following and recommends IV antibiotics for additional two weeks at home.   3. Chronic pancreatitis by EUS.      LOS: 5 days   Willette Cluster  05/29/2013, 11:08 AM     Attending physician's Byrd   I have taken an interval history, reviewed the chart and examined the patient. I agree with the Advanced Practitioner's Byrd, impression and recommendations. Biliary stent functioning well. Transitioning to oral pain medications and outpatient IV antibiotic regimen. OP evaluation at [facility] has been arranged for Monday. Hopefully he will be ready for discharge to make that appt.   Venita Lick. Russella Dar, MD [facility]

## 2013-05-29 NOTE — Progress Notes (Addendum)
[facility]   Day # 6 imipenem Day # 3 gentamicin   Subjective: Feels better  Antibiotics:  Anti-infectives   Start     Dose/Rate Route Frequency Ordered Stop   05/27/13 1600  gentamicin (GARAMYCIN) IVPB 80 mg     80 mg 100 mL/hr over 30 Minutes Intravenous Every 8 hours 05/27/13 1538     05/27/13 0800  vancomycin (VANCOCIN) 1,250 mg in sodium chloride 0.9 % 250 mL IVPB  Status:  Discontinued     1,250 mg 166.7 mL/hr over 90 Minutes Intravenous Every 8 hours 05/27/13 0340 05/28/13 0948   05/25/13 1200  imipenem-cilastatin (PRIMAXIN) 500 mg in sodium chloride 0.9 % 100 mL IVPB     500 mg 200 mL/hr over 30 Minutes Intravenous 4 times per day 05/25/13 0854     05/25/13 0030  vancomycin (VANCOCIN) IVPB 1000 mg/200 mL premix  Status:  Discontinued     1,000 mg 200 mL/hr over 60 Minutes Intravenous Every 8 hours 05/25/13 0000 05/27/13 0340   05/24/13 2359  imipenem-cilastatin (PRIMAXIN) 500 mg in sodium chloride 0.9 % 100 mL IVPB  Status:  Discontinued     500 mg 200 mL/hr over 30 Minutes Intravenous Every 8 hours 05/24/13 2334 05/25/13 0854   05/24/13 1930  ciprofloxacin (CIPRO) IVPB 400 mg     400 mg 200 mL/hr over 60 Minutes Intravenous  Once 05/24/13 1905 05/24/13 2137   05/24/13 1915  metroNIDAZOLE (FLAGYL) IVPB 500 mg     500 mg 100 mL/hr over 60 Minutes Intravenous  Once 05/24/13 1905 05/24/13 2254      Medications: Scheduled Meds: . diphenhydrAMINE  12.5 mg Intravenous Once  . enoxaparin (LOVENOX) injection  40 mg Subcutaneous Q24H  . gentamicin  80 mg Intravenous Q8H  . imipenem-cilastatin  500 mg Intravenous Q6H  . pantoprazole  40 mg Oral Daily  . sodium chloride  3 mL Intravenous Q12H   Continuous Infusions: . sodium chloride 75 mL/hr (05/28/13 1427)  . sodium chloride 20 mL/hr (05/27/13 1730)   PRN Meds:.calcium carbonate, HYDROcodone-acetaminophen, HYDROmorphone (DILAUDID) injection, hydrOXYzine, ondansetron (ZOFRAN) IV,  ondansetron   Objective: Weight change:   Intake/Output Summary (Last 24 hours) at 05/29/13 1113 Last data filed at 05/29/13 0900  Gross per 24 hour  Intake   1480 ml  Output   1200 ml  Net    280 ml   Blood pressure 137/83, pulse 83, temperature 98.9 F (37.2 C), temperature source Oral, resp. rate 12, height 5\' 10"  (1.778 m), weight 210 lb 11.2 oz (95.573 kg), SpO2 96.00%. Temp:  [98.2 F (36.8 C)-98.9 F (37.2 C)] 98.9 F (37.2 C) (10/23 0553) Pulse Rate:  [79-88] 83 (10/23 0553) Resp:  [11-16] 12 (10/23 0553) BP: (128-196)/(83-119) 137/83 mmHg (10/23 0553) SpO2:  [92 %-99 %] 96 % (10/23 0553)  Physical Exam: General: Alert and awake, oriented x3, not in any acute distress, sitting in bedside chair reading paper HEENT: less  icteric sclera,EOMI, oropharynx clear and without exudate  CVS tachy rate, normal r, no murmur rubs or gallops  Chest: clear to auscultation bilaterally, no wheezing, rales or rhonchi  Abdomen: soft TTP RUQ, nondistended, normal bowel sounds,  Extremities: no clubbing or edema noted bilaterally  Skin: no rashes  Neuro: nonfocal, strength and sensation intact  Lab Results:BMET  Micro Results: Recent Results (from the past 240 hour(s))  CULTURE, BLOOD (ROUTINE X 2)     Status: None   Collection Time  05/24/13  9:45 PM      Result Value Range Status   Specimen Description BLOOD LEFT ANTECUBITAL   Final   Special Requests BOTTLES DRAWN AEROBIC AND ANAEROBIC 5CC   Final   Culture  Setup Time     Final   Value: 05/25/2013 01:21     Performed at [facility]   Culture     Final   Value: ENTEROCOCCUS SPECIES     Note: COMBINATION THERAPY OF HIGH DOSE AMPICILLIN OR VANCOMYCIN, PLUS AN AMINOGLYCOSIDE, IS USUALLY INDICATED FOR SERIOUS ENTEROCOCCAL INFECTIONS.     VIRIDANS STREPTOCOCCUS     Note: Gram Stain Report Called to,Read Back By and Verified With: JENNY BURNS 05/25/13 @ 12:40PM BY RUSCOE A.     Performed at [facility]    Report Status 05/28/2013 FINAL   Final   Organism ID, Bacteria ENTEROCOCCUS SPECIES   Final  CULTURE, BLOOD (ROUTINE X 2)     Status: None   Collection Time    05/24/13 10:00 PM      Result Value Range Status   Specimen Description BLOOD LEFT HAND   Final   Special Requests BOTTLES DRAWN AEROBIC AND ANAEROBIC 5CC   Final   Culture  Setup Time     Final   Value: 05/25/2013 01:20     Performed at [facility]   Culture     Final   Value: ENTEROCOCCUS SPECIES     Note: SUSCEPTIBILITIES PERFORMED ON PREVIOUS CULTURE WITHIN THE LAST 5 DAYS.     VIRIDANS STREPTOCOCCUS     Note: Susceptibilities performed on previous culture within the last 5 days.     Note: Gram Stain Report Called to,Read Back By and Verified With: JENNY BURNS 05/25/13 @ 12:40PM BY RUSCOE A.   Report Status 05/28/2013 FINAL   Final  CULTURE, BLOOD (ROUTINE X 2)     Status: None   Collection Time    05/26/13  3:27 PM      Result Value Range Status   Specimen Description BLOOD LEFT HAND   Final   Special Requests BOTTLES DRAWN AEROBIC AND ANAEROBIC 5CC   Final   Culture  Setup Time     Final   Value: 05/26/2013 20:42     Performed at [facility]   Culture     Final   Value:        BLOOD CULTURE RECEIVED NO GROWTH TO DATE CULTURE WILL BE HELD FOR 5 DAYS BEFORE ISSUING A FINAL NEGATIVE REPORT     Performed at [facility]   Report Status PENDING   Incomplete  SURGICAL PCR SCREEN     Status: Abnormal   Collection Time    05/26/13  3:30 PM      Result Value Range Status   MRSA, PCR INVALID RESULTS, SPECIMEN SENT FOR CULTURE (*) NEGATIVE Final   Comment: RCRV     A THOMPSON RN 1902 05/26/13 A NAVARRO   Staphylococcus aureus INVALID RESULTS, SPECIMEN SENT FOR CULTURE (*) NEGATIVE Final   Comment:            The Xpert SA Assay (FDA     approved for NASAL specimens     in patients over 6 years of age),     is one component of     a comprehensive surveillance     program.  Test performance has       been validated by [employer] for patients  greater     than or equal to 6 year old.     It is not intended     to diagnose infection nor to     guide or monitor treatment.  CULTURE, BLOOD (ROUTINE X 2)     Status: None   Collection Time    05/26/13  3:34 PM      Result Value Range Status   Specimen Description BLOOD RIGHT HAND   Final   Special Requests BOTTLES DRAWN AEROBIC ONLY 3CC   Final   Culture  Setup Time     Final   Value: 05/26/2013 20:39     Performed at [facility]   Culture     Final   Value: ENTEROCOCCUS SPECIES     Note: SUSCEPTIBILITIES PERFORMED ON PREVIOUS CULTURE WITHIN THE LAST 5 DAYS.     Note: Gram Stain Report Called to,Read Back By and Verified With: ANNIE H @ 1400 05/27/13 BY KRAWS     Performed at [facility]   Report Status 05/28/2013 FINAL   Final  MRSA CULTURE     Status: None   Collection Time    05/26/13  7:00 PM      Result Value Range Status   Specimen Description NOSE   Final   Special Requests NONE   Final   Culture     Final   Value: NO SUSPICIOUS COLONIES, CONTINUING TO HOLD     Performed at [facility]   Report Status PENDING   Incomplete  CULTURE, BLOOD (ROUTINE X 2)     Status: None   Collection Time    05/27/13  4:00 PM      Result Value Range Status   Specimen Description BLOOD LEFT HAND   Final   Special Requests BOTTLES DRAWN AEROBIC AND ANAEROBIC 10CC   Final   Culture  Setup Time     Final   Value: 05/27/2013 21:01     Performed at [facility]   Culture     Final   Value:        BLOOD CULTURE RECEIVED NO GROWTH TO DATE CULTURE WILL BE HELD FOR 5 DAYS BEFORE ISSUING A FINAL NEGATIVE REPORT     Performed at [facility]   Report Status PENDING   Incomplete  CULTURE, BLOOD (ROUTINE X 2)     Status: None   Collection Time    05/27/13  4:15 PM      Result Value Range Status   Specimen Description BLOOD LEFT ARM   Final   Special Requests BOTTLES DRAWN AEROBIC AND ANAEROBIC  10CC   Final   Culture  Setup Time     Final   Value: 05/27/2013 21:01     Performed at [facility]   Culture     Final   Value:        BLOOD CULTURE RECEIVED NO GROWTH TO DATE CULTURE WILL BE HELD FOR 5 DAYS BEFORE ISSUING A FINAL NEGATIVE REPORT     Performed at [facility]   Report Status PENDING   Incomplete    Studies/Results: No results found.    Assessment/Plan: Terry Byrd is a 57 y.o. 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 TEE was negative for vegetations.   #1 ENterococcal bacteremia : undoubtedly due to his cholangitis. While his TEE was negative his persistently positive cultures  with organism typical for endocarditis make me concerned. I feel VERY strongly that he should AT MINIMUm be treated for two weeks with IMIPENEM and GENTAMICIN from date of first negative blood culture and that we should consider extending this to 4 weeks for an "endocarditis" regimen. Regardless we are going to want to get surveillance blood cultures 2 weeks post stopping abx  --DO NOT PLACE PICC UNTIL WE HAVE PROVED CLEARNANCE OF HIS BACTEREMIA   -- GIVEN THAT ENTEROCOCCUS CAN TAKE A WHILE TO GROW ON CULTURE, I WOULD NOT PLACE PICC UNTIL TOMORROW AT THE EARLIEST AND THEN LATER IN THE DAY--IF CULTURES ARE STILL NEGATIVE, IF PT MIGHT BE STAYING THRU Saturday COULD PLACE ON Saturday INSTEAD (his preference is to leave late tomorrow)  --He will need weekly cbc bmp as well as gentamicin levels faxed to me at [phone] but I will need his infusion co pharmacist to be dosing his gentamicin    He currently has a hospital followup with Dr. Orvan Falconer in [facility] on November 6th at 2pm  I would like him to stay on the imipenem and gentamicin until he sees Dr. Orvan Falconer on the 6th (this will technically be a few days beyond 2 weeks of IV abx) but I would prefer  to have discussion in office re whether to stop shortly after 2 weeks vs going to 4 weeks  Dr. Drue Second is covering tomorrow and this weekend for me    LOS: 5 days   Acey Lav 05/29/2013, 11:13 AM

## 2013-05-29 NOTE — Progress Notes (Signed)
Spoke with pt's wife at bedside concerning Home Health, IV ABX and HHRN. She selected [facility], referral given to in house rep. Will need home health orders for [facility] with IV ABX.

## 2013-05-29 NOTE — Progress Notes (Signed)
[facility] PROGRESS NOTE  Terry Byrd NWG:[id number] DOB: 06/04/1956 DOA: 05/24/2013 PCP: [name], MD  Assessment/Plan: Enterococcus bacteremia  -currently on  Imipenem/gent per ID  -2D ECHO with no vegetations reported,  TEE negative for  Endocarditis -Secondary to acute cholangitis  -Repeat Blood cx from 10/21 negative so far -Will d/w Dr.Van Dam regarding placing PICC tomorrow if cultures remain negative -plan for atleast 2weeks of Abx from 10/21  Acute cholangitis  -improving -Secondary to biliary stent migration and obstruction of CBD -s/p ERCP/dilation and stent exchange 10/20  -LFTs/bili appreciate GI input -advance diet to bland   Uncontrolled pain  -improving -weaned off PCA as tolerated -change to Po narcotics as tolerated  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 Wall thickness was increased in a pattern of mild LVH. Systolic function was vigorous. The estimated ejection fraction was in the range of 65% to 70%. Although no diagnostic regional wall motion abnormality was identified, this possibility cannot be completely excluded on the basis of this study. Left ventricular diastolic function parameters were normal.     ERCP- with STENT PLACEMENT per GI  Antibiotics:  vanc and imipenem started 10/18  HPI/Subjective: States abd pain about 2-3/10, NPO for TEE  Objective: Filed Vitals:   05/29/13 0553  BP: 137/83  Pulse: 83  Temp: 98.9 F (37.2 C)  Resp: 12    Intake/Output Summary (Last 24 hours) at 05/29/13 0806 Last data filed at 05/29/13 0700  Gross per 24 hour  Intake   1243 ml  Output   1200 ml  Net     43 ml   Filed Weights   05/24/13  2346  Weight: 95.573 kg (210 lb 11.2 oz)   Exam:  General: alert & oriented x 3 In NAD, jaundiced Cardiovascular: RRR, nl S1 s2 Respiratory: CTAB Abdomen: soft +BS NT/ND, no masses palpable Extremities: No cyanosis and no edema    Data Reviewed: Basic Metabolic Panel:  Recent Labs Lab 05/24/13 1935 05/25/13 0510 05/26/13 0320 05/28/13 0240 05/29/13 0546  NA 135 135 132* 132* 134*  K 4.2 4.0 3.8 3.6 3.5  CL 101 101 98 100 100  CO2 23 21 27 26 24   GLUCOSE 125* 132* 136* 95 98  BUN 21 23 17 11 10   CREATININE 0.90 1.09 0.80 0.83 0.82  CALCIUM 8.8 9.0 8.6 8.5 8.9   Liver Function Tests:  Recent Labs Lab 05/26/13 1409 05/27/13 0129 05/27/13 1434 05/28/13 0237 05/29/13 0546  AST 326* 243* 249* 245* 192*  ALT 562* 450* 413* 379* 331*  ALKPHOS 332* 352* 389* 401* 457*  BILITOT 8.1* 6.9* 5.5* 4.6* 3.7*  PROT 5.6* 5.2* 5.5* 5.4* 5.8*  ALBUMIN 2.4* 2.2* 2.2* 2.2* 2.4*    Recent Labs Lab 05/24/13 1935 05/26/13 0320  LIPASE 66* 13  AMYLASE  --  36   No results found for this basename: AMMONIA,  in the last 168 hours CBC:  Recent Labs Lab 05/24/13 1935 05/25/13 0510 05/28/13 0240 05/29/13 0546  WBC 10.0 15.0* 7.3 7.3  NEUTROABS 8.9* 13.6*  --   --   HGB 14.5 13.4 11.9* 12.4*  HCT 43.0 39.9 34.8* 36.4*  MCV 89.4 90.7 88.3 86.7  PLT  230 179 146* 189   Cardiac Enzymes: No results found for this basename: CKTOTAL, CKMB, CKMBINDEX, TROPONINI,  in the last 168 hours BNP (last 3 results) No results found for this basename: PROBNP,  in the last 8760 hours CBG: No results found for this basename: GLUCAP,  in the last 168 hours  Recent Results (from the past 240 hour(s))  CULTURE, BLOOD (ROUTINE X 2)     Status: None   Collection Time    05/24/13  9:45 PM      Result Value Range Status   Specimen Description BLOOD LEFT ANTECUBITAL   Final   Special Requests BOTTLES DRAWN AEROBIC AND ANAEROBIC 5CC   Final   Culture  Setup Time     Final   Value: 05/25/2013  01:21     Performed at [facility]   Culture     Final   Value: ENTEROCOCCUS SPECIES     Note: COMBINATION THERAPY OF HIGH DOSE AMPICILLIN OR VANCOMYCIN, PLUS AN AMINOGLYCOSIDE, IS USUALLY INDICATED FOR SERIOUS ENTEROCOCCAL INFECTIONS.     VIRIDANS STREPTOCOCCUS     Note: Gram Stain Report Called to,Read Back By and Verified With: JENNY BURNS 05/25/13 @ 12:40PM BY RUSCOE A.     Performed at [facility]   Report Status 05/28/2013 FINAL   Final   Organism ID, Bacteria ENTEROCOCCUS SPECIES   Final  CULTURE, BLOOD (ROUTINE X 2)     Status: None   Collection Time    05/24/13 10:00 PM      Result Value Range Status   Specimen Description BLOOD LEFT HAND   Final   Special Requests BOTTLES DRAWN AEROBIC AND ANAEROBIC 5CC   Final   Culture  Setup Time     Final   Value: 05/25/2013 01:20     Performed at [facility]   Culture     Final   Value: ENTEROCOCCUS SPECIES     Note: SUSCEPTIBILITIES PERFORMED ON PREVIOUS CULTURE WITHIN THE LAST 5 DAYS.     VIRIDANS STREPTOCOCCUS     Note: Susceptibilities performed on previous culture within the last 5 days.     Note: Gram Stain Report Called to,Read Back By and Verified With: JENNY BURNS 05/25/13 @ 12:40PM BY RUSCOE A.   Report Status 05/28/2013 FINAL   Final  CULTURE, BLOOD (ROUTINE X 2)     Status: None   Collection Time    05/26/13  3:27 PM      Result Value Range Status   Specimen Description BLOOD LEFT HAND   Final   Special Requests BOTTLES DRAWN AEROBIC AND ANAEROBIC 5CC   Final   Culture  Setup Time     Final   Value: 05/26/2013 20:42     Performed at [facility]   Culture     Final   Value:        BLOOD CULTURE RECEIVED NO GROWTH TO DATE CULTURE WILL BE HELD FOR 5 DAYS BEFORE ISSUING A FINAL NEGATIVE REPORT     Performed at [facility]   Report Status PENDING   Incomplete  SURGICAL PCR SCREEN     Status: Abnormal   Collection Time    05/26/13  3:30 PM      Result Value Range Status    MRSA, PCR INVALID RESULTS, SPECIMEN SENT FOR CULTURE (*) NEGATIVE Final   Comment: RCRV     A THOMPSON RN 1902 05/26/13 A NAVARRO   Staphylococcus aureus INVALID RESULTS, SPECIMEN SENT FOR  CULTURE (*) NEGATIVE Final   Comment:            The Xpert SA Assay (FDA     approved for NASAL specimens     in patients over 4 years of age),     is one component of     a comprehensive surveillance     program.  Test performance has     been validated by [employer] for patients greater     than or equal to 75 year old.     It is not intended     to diagnose infection nor to     guide or monitor treatment.  CULTURE, BLOOD (ROUTINE X 2)     Status: None   Collection Time    05/26/13  3:34 PM      Result Value Range Status   Specimen Description BLOOD RIGHT HAND   Final   Special Requests BOTTLES DRAWN AEROBIC ONLY 3CC   Final   Culture  Setup Time     Final   Value: 05/26/2013 20:39     Performed at [facility]   Culture     Final   Value: ENTEROCOCCUS SPECIES     Note: SUSCEPTIBILITIES PERFORMED ON PREVIOUS CULTURE WITHIN THE LAST 5 DAYS.     Note: Gram Stain Report Called to,Read Back By and Verified With: ANNIE H @ 1400 05/27/13 BY KRAWS     Performed at [facility]   Report Status 05/28/2013 FINAL   Final  MRSA CULTURE     Status: None   Collection Time    05/26/13  7:00 PM      Result Value Range Status   Specimen Description NOSE   Final   Special Requests NONE   Final   Culture     Final   Value: NO SUSPICIOUS COLONIES, CONTINUING TO HOLD     Performed at [facility]   Report Status PENDING   Incomplete  CULTURE, BLOOD (ROUTINE X 2)     Status: None   Collection Time    05/27/13  4:00 PM      Result Value Range Status   Specimen Description BLOOD LEFT HAND   Final   Special Requests BOTTLES DRAWN AEROBIC AND ANAEROBIC 10CC   Final   Culture  Setup Time     Final   Value: 05/27/2013 21:01     Performed at [facility]   Culture      Final   Value:        BLOOD CULTURE RECEIVED NO GROWTH TO DATE CULTURE WILL BE HELD FOR 5 DAYS BEFORE ISSUING A FINAL NEGATIVE REPORT     Performed at [facility]   Report Status PENDING   Incomplete  CULTURE, BLOOD (ROUTINE X 2)     Status: None   Collection Time    05/27/13  4:15 PM      Result Value Range Status   Specimen Description BLOOD LEFT ARM   Final   Special Requests BOTTLES DRAWN AEROBIC AND ANAEROBIC 10CC   Final   Culture  Setup Time     Final   Value: 05/27/2013 21:01     Performed at [facility]   Culture     Final   Value:        BLOOD CULTURE RECEIVED NO GROWTH TO DATE CULTURE WILL BE HELD FOR 5 DAYS BEFORE ISSUING A FINAL NEGATIVE REPORT  Performed at [facility]   Report Status PENDING   Incomplete     Studies: No results found.  Scheduled Meds: . diphenhydrAMINE  12.5 mg Intravenous Once  . enoxaparin (LOVENOX) injection  40 mg Subcutaneous Q24H  . gentamicin  80 mg Intravenous Q8H  . imipenem-cilastatin  500 mg Intravenous Q6H  . pantoprazole  40 mg Oral Daily  . sodium chloride  3 mL Intravenous Q12H   Continuous Infusions: . sodium chloride 75 mL/hr (05/28/13 1427)  . sodium chloride 20 mL/hr (05/27/13 1730)    Active Problems:   Pancreatic mass   Obstruction of bile duct   Sepsis   Acute cholangitis   Transaminasemia    Time spent:    [facility]  [facility] Pager [phone]. If 7PM-7AM, please contact night-coverage at [email or web address], password [facility] 05/29/2013, 8:06 AM  LOS: 5 days

## 2013-05-29 NOTE — Telephone Encounter (Signed)
Received a phone call from [facility]- Dr. Jacquenette Shone office.  Patient is scheduled for an appt for Monday 06/02/13 12:00.  [facility] [address] [address], [place] [address] phone number [phone].  Patient is asked to bring the imaging disk of any abdominal x-rays and the paper reports with him to the appt.  He is also asked if possible to bring a copy of the discharge summary.  I spoke with the patient and his sister and they verbalized understanding of appt date and time.

## 2013-05-30 ENCOUNTER — Encounter: Payer: Self-pay | Admitting: Internal Medicine

## 2013-05-30 LAB — COMPREHENSIVE METABOLIC PANEL
AST: 145 U/L — ABNORMAL HIGH (ref 0–37)
Albumin: 2.2 g/dL — ABNORMAL LOW (ref 3.5–5.2)
Alkaline Phosphatase: 458 U/L — ABNORMAL HIGH (ref 39–117)
BUN: 11 mg/dL (ref 6–23)
CO2: 26 mEq/L (ref 19–32)
Chloride: 101 mEq/L (ref 96–112)
Creatinine, Ser: 0.82 mg/dL (ref 0.50–1.35)
GFR calc non Af Amer: >90 mL/min (ref >90)
Glucose, Bld: 114 mg/dL — ABNORMAL HIGH (ref 70–99)
Potassium: 3.2 mEq/L — ABNORMAL LOW (ref 3.5–5.1)
Total Bilirubin: 3 mg/dL — ABNORMAL HIGH (ref 0.3–1.2)

## 2013-05-30 MED ORDER — HYDROMORPHONE HCL PF 1 MG/ML IJ SOLN
1.0000 mg | INTRAMUSCULAR | Status: DC | PRN
  Administered 2013-05-30: 1 mg via INTRAVENOUS

## 2013-05-30 MED ORDER — POLYETHYLENE GLYCOL 3350 17 G PO PACK
17.0000 g | PACK | Freq: Every day | ORAL | Status: DC | PRN
  Filled 2013-05-30: qty 1

## 2013-05-30 MED ORDER — MORPHINE SULFATE ER 15 MG PO TBCR: 15.0000 mg | EXTENDED_RELEASE_TABLET | Freq: Two times a day (BID) | ORAL | Status: DC

## 2013-05-30 MED ORDER — ENSURE COMPLETE PO LIQD
237.0000 mL | ORAL | Status: DC
  Administered 2013-05-31: 15:00:00 237 mL via ORAL

## 2013-05-30 MED ORDER — LORAZEPAM 0.5 MG PO TABS
0.5000 mg | ORAL_TABLET | Freq: Four times a day (QID) | ORAL | Status: DC | PRN
  Administered 2013-05-30 – 2013-05-31 (×4): 0.5 mg via ORAL
  Filled 2013-05-30 (×4): qty 1

## 2013-05-30 MED ORDER — OXYCODONE HCL ER 15 MG PO T12A
15.0000 mg | EXTENDED_RELEASE_TABLET | Freq: Two times a day (BID) | ORAL | Status: DC
  Administered 2013-05-30 – 2013-05-31 (×2): 15 mg via ORAL
  Filled 2013-05-30 (×2): qty 1

## 2013-05-30 MED ORDER — HYDROMORPHONE HCL PF 1 MG/ML IJ SOLN
1.0000 mg | INTRAMUSCULAR | Status: DC | PRN
Start: 2013-05-30 — End: 2013-05-30
  Filled 2013-05-30: qty 1

## 2013-05-30 NOTE — Progress Notes (Signed)
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  -Repeat Blood cx from 10/21 negative so far -Place PICC this afternoon if cultures remain negative  Acute cholangitis  -improving -Secondary to biliary stent migration and obstruction of CBD -s/p ERCP/dilation and stent exchange 10/20  -LFTs/bili appreciate GI input -tolerating advanced diet   Abd pain  -fluctuating -weaned off PCA  -PRN vicodin and dilaudid currently, wean off IV dilaudid as tolerated  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 Wall thickness was increased in a pattern of mild LVH. Systolic function was vigorous. The estimated ejection fraction was in the range of 65% to 70%. Although no diagnostic regional wall motion abnormality was identified, this possibility cannot be completely excluded on the basis of this study. Left ventricular diastolic function parameters were normal.     ERCP- with STENT PLACEMENT per GI  Antibiotics:  vanc and imipenem started 10/18  HPI/Subjective: States abd pain about 5-6/10, tolerating diet  Objective: Filed Vitals:   05/30/13 1412  BP: 129/90  Pulse: 78  Temp: 98.3 F (36.8 C)  Resp: 16    Intake/Output Summary (Last 24 hours) at 05/30/13 1453 Last data filed at 05/30/13 1300  Gross per 24 hour  Intake 3490.5 ml  Output    350 ml  Net 3140.5 ml   Filed Weights   05/24/13 2346  Weight: 95.573 kg (210 lb 11.2 oz)    Exam:  General: alert & oriented x 3 In NAD, jaundiced Cardiovascular: RRR, nl S1 s2 Respiratory: CTAB Abdomen: soft +BS NT/ND, no masses palpable Extremities: No cyanosis and no edema    Data Reviewed: Basic Metabolic Panel:  Recent Labs Lab 05/25/13 0510 05/26/13 0320 05/28/13 0240 05/29/13 0546 05/30/13 0521  NA 135 132* 132* 134* 136  K 4.0 3.8 3.6 3.5 3.2*  CL 101 98 100 100 101  CO2 21 27 26 24 26   GLUCOSE 132* 136* 95 98 114*  BUN 23 17 11 10 11   CREATININE 1.09 0.80 0.83 0.82 0.82  CALCIUM 9.0 8.6 8.5 8.9 8.6   Liver Function Tests:  Recent Labs Lab 05/27/13 0129 05/27/13 1434 05/28/13 0237 05/29/13 0546 05/30/13 0521  AST 243* 249* 245* 192* 145*  ALT 450* 413* 379* 331* 261*  ALKPHOS 352* 389* 401* 457* 458*  BILITOT 6.9* 5.5* 4.6* 3.7* 3.0*  PROT 5.2* 5.5* 5.4* 5.8* 5.6*  ALBUMIN 2.2* 2.2* 2.2* 2.4* 2.2*    Recent Labs Lab 05/24/13 1935 05/26/13 0320  LIPASE 66* 13  AMYLASE  --  36   No results found for this basename: AMMONIA,  in the last 168 hours CBC:  Recent Labs Lab 05/24/13 1935 05/25/13 0510 05/28/13 0240 05/29/13 0546  WBC 10.0 15.0* 7.3 7.3  NEUTROABS 8.9* 13.6*  --   --   HGB 14.5 13.4 11.9* 12.4*  HCT 43.0 39.9 34.8* 36.4*  MCV 89.4 90.7 88.3 86.7  PLT 230 179 146* 189   Cardiac Enzymes: No results found for this  basename: CKTOTAL, CKMB, CKMBINDEX, TROPONINI,  in the last 168 hours BNP (last 3 results) No results found for this basename: PROBNP,  in the last 8760 hours CBG: No results found for this basename: GLUCAP,  in the last 168 hours  Recent Results (from the past 240 hour(s))  CULTURE, BLOOD (ROUTINE X 2)     Status: None   Collection Time    05/24/13  9:45 PM      Result Value Range Status   Specimen Description BLOOD LEFT ANTECUBITAL   Final   Special Requests BOTTLES DRAWN AEROBIC AND ANAEROBIC 5CC   Final   Culture  Setup Time     Final   Value: 05/25/2013 01:21     Performed at [facility]   Culture     Final   Value: ENTEROCOCCUS SPECIES     Note: COMBINATION THERAPY OF HIGH DOSE AMPICILLIN OR VANCOMYCIN, PLUS AN AMINOGLYCOSIDE, IS USUALLY INDICATED FOR SERIOUS ENTEROCOCCAL INFECTIONS.     VIRIDANS STREPTOCOCCUS     Note: Gram Stain Report Called to,Read Back By and Verified With: JENNY BURNS 05/25/13 @ 12:40PM BY RUSCOE A.     Performed at [facility]   Report Status 05/28/2013 FINAL   Final   Organism ID, Bacteria ENTEROCOCCUS SPECIES   Final  CULTURE, BLOOD (ROUTINE X 2)     Status: None   Collection Time    05/24/13 10:00 PM      Result Value Range Status   Specimen Description BLOOD LEFT HAND   Final   Special Requests BOTTLES DRAWN AEROBIC AND ANAEROBIC 5CC   Final   Culture  Setup Time     Final   Value: 05/25/2013 01:20     Performed at [facility]   Culture     Final   Value: ENTEROCOCCUS SPECIES     Note: SUSCEPTIBILITIES PERFORMED ON PREVIOUS CULTURE WITHIN THE LAST 5 DAYS.     VIRIDANS STREPTOCOCCUS     Note: Susceptibilities performed on previous culture within the last 5 days.     Note: Gram Stain Report Called to,Read Back By and Verified With: JENNY BURNS 05/25/13 @ 12:40PM BY RUSCOE A.   Report Status 05/28/2013 FINAL   Final  CULTURE, BLOOD (ROUTINE X 2)     Status: None   Collection Time    05/26/13  3:27 PM      Result Value Range Status   Specimen Description BLOOD LEFT HAND   Final   Special Requests BOTTLES DRAWN AEROBIC AND ANAEROBIC 5CC   Final   Culture  Setup Time     Final   Value: 05/26/2013 20:42     Performed at [facility]   Culture     Final   Value:        BLOOD CULTURE RECEIVED NO GROWTH TO DATE CULTURE WILL BE HELD FOR 5 DAYS BEFORE ISSUING A FINAL NEGATIVE REPORT     Performed at [facility]   Report Status PENDING   Incomplete  SURGICAL PCR SCREEN     Status: Abnormal   Collection Time    05/26/13  3:30 PM      Result Value Range Status   MRSA, PCR INVALID RESULTS, SPECIMEN  SENT FOR CULTURE (*) NEGATIVE Final   Comment: RCRV     A THOMPSON RN 1902 05/26/13 A NAVARRO   Staphylococcus aureus INVALID RESULTS, SPECIMEN SENT FOR CULTURE (*) NEGATIVE Final   Comment:  The Xpert SA Assay (FDA     approved for NASAL specimens     in patients over 3 years of age),     is one component of     a comprehensive surveillance     program.  Test performance has     been validated by [employer] for patients greater     than or equal to 71 year old.     It is not intended     to diagnose infection nor to     guide or monitor treatment.  CULTURE, BLOOD (ROUTINE X 2)     Status: None   Collection Time    05/26/13  3:34 PM      Result Value Range Status   Specimen Description BLOOD RIGHT HAND   Final   Special Requests BOTTLES DRAWN AEROBIC ONLY 3CC   Final   Culture  Setup Time     Final   Value: 05/26/2013 20:39     Performed at [facility]   Culture     Final   Value: ENTEROCOCCUS SPECIES     Note: SUSCEPTIBILITIES PERFORMED ON PREVIOUS CULTURE WITHIN THE LAST 5 DAYS.     Note: Gram Stain Report Called to,Read Back By and Verified With: ANNIE H @ 1400 05/27/13 BY KRAWS     Performed at [facility]   Report Status 05/28/2013 FINAL   Final  MRSA CULTURE     Status: None   Collection Time    05/26/13  7:00 PM      Result Value Range Status   Specimen Description NOSE   Final   Special Requests NONE   Final   Culture     Final   Value: NO STAPHYLOCOCCUS AUREUS ISOLATED     Note: NOMRSA     Performed at [facility]   Report Status 05/29/2013 FINAL   Final  CULTURE, BLOOD (ROUTINE X 2)     Status: None   Collection Time    05/27/13  4:00 PM      Result Value Range Status   Specimen Description BLOOD LEFT HAND   Final   Special Requests BOTTLES DRAWN AEROBIC AND ANAEROBIC 10CC   Final   Culture  Setup Time     Final   Value: 05/27/2013 21:01     Performed at [facility]   Culture     Final   Value:         BLOOD CULTURE RECEIVED NO GROWTH TO DATE CULTURE WILL BE HELD FOR 5 DAYS BEFORE ISSUING A FINAL NEGATIVE REPORT     Performed at [facility]   Report Status PENDING   Incomplete  CULTURE, BLOOD (ROUTINE X 2)     Status: None   Collection Time    05/27/13  4:15 PM      Result Value Range Status   Specimen Description BLOOD LEFT ARM   Final   Special Requests BOTTLES DRAWN AEROBIC AND ANAEROBIC 10CC   Final   Culture  Setup Time     Final   Value: 05/27/2013 21:01     Performed at [facility]   Culture     Final   Value:        BLOOD CULTURE RECEIVED NO GROWTH TO DATE CULTURE WILL BE HELD FOR 5 DAYS BEFORE ISSUING A FINAL NEGATIVE REPORT     Performed at [facility]   Report Status PENDING  Incomplete     Studies: No results found.  Scheduled Meds: . diphenhydrAMINE  12.5 mg Intravenous Once  . enoxaparin (LOVENOX) injection  40 mg Subcutaneous Q24H  . feeding supplement (ENSURE COMPLETE)  237 mL Oral Q24H  . gentamicin  80 mg Intravenous Q8H  . imipenem-cilastatin  500 mg Intravenous Q6H  . pantoprazole  40 mg Oral Daily  . sodium chloride  3 mL Intravenous Q12H   Continuous Infusions: . sodium chloride 75 mL/hr at 05/29/13 1925  . sodium chloride 20 mL/hr (05/27/13 1730)    Active Problems:   Pancreatic mass   Obstruction of bile duct   Sepsis   Acute cholangitis   Transaminasemia    Time spent:    [facility]  [facility] Pager [phone]. If 7PM-7AM, please contact night-coverage at [email or web address], password [facility] 05/30/2013, 2:53 PM  LOS: 6 days

## 2013-05-30 NOTE — Progress Notes (Signed)
Spoke with Dr. Jomarie Longs and updated on pt's pain control issues, and conversation with Dr. Russella Dar. Order to increase Dilaudid to 1-2mg  q 3 hours PRN. Pt informed and states he only wants 1mg  now.

## 2013-05-30 NOTE — Progress Notes (Signed)
PICC nurse here to place PICC, and wife is stating that she would like to speak with Dr. Daiva Eves regarding placement. Dr. Jomarie Longs entered room and assured pt and wife that she spoke with covering ID doc Dr. Ilsa Iha, and ok to place PICC. Dr. Jomarie Longs reassuring pt and wife about his pain control.  Pt's wife did call Dr. Marina Goodell and the GI PA Gunnar Fusi will come over and speak with them.  Pt and wife now refusing PICC placement until tomorrow. Dr. Jomarie Longs at bedside and aware. Pt and wife aware that both of pt's IV sites are out of date. However, both sites are clear and flush well-still past 4 days.

## 2013-05-30 NOTE — Progress Notes (Signed)
Pt soundly sleeping shortly after adm of dilaudid.

## 2013-05-30 NOTE — Progress Notes (Signed)
Call to Dr. Russella Dar as pt wife states his pain and condition is returning to "saturday". He is up sitting on toilet, and states he pain is now an "8". Will get Dilaudid as it is due at 14:30. I will page Dr. Jomarie Longs and update, as Dr. Russella Dar want her to manage pt's pain. Dr. Russella Dar states that he spoke at length to patient and his wife and informed them that pancreatitis remains problematic, but coleitises is resolved. Will follow.

## 2013-05-30 NOTE — Progress Notes (Signed)
[facility]   Day # 7 imipenem Day # 4 gentamicin   Subjective: Having abdominal pain today  Antibiotics:  Anti-infectives   Start     Dose/Rate Route Frequency Ordered Stop   05/27/13 1600  gentamicin (GARAMYCIN) IVPB 80 mg     80 mg 100 mL/hr over 30 Minutes Intravenous Every 8 hours 05/27/13 1538     05/27/13 0800  vancomycin (VANCOCIN) 1,250 mg in sodium chloride 0.9 % 250 mL IVPB  Status:  Discontinued     1,250 mg 166.7 mL/hr over 90 Minutes Intravenous Every 8 hours 05/27/13 0340 05/28/13 0948   05/25/13 1200  imipenem-cilastatin (PRIMAXIN) 500 mg in sodium chloride 0.9 % 100 mL IVPB     500 mg 200 mL/hr over 30 Minutes Intravenous 4 times per day 05/25/13 0854     05/25/13 0030  vancomycin (VANCOCIN) IVPB 1000 mg/200 mL premix  Status:  Discontinued     1,000 mg 200 mL/hr over 60 Minutes Intravenous Every 8 hours 05/25/13 0000 05/27/13 0340   05/24/13 2359  imipenem-cilastatin (PRIMAXIN) 500 mg in sodium chloride 0.9 % 100 mL IVPB  Status:  Discontinued     500 mg 200 mL/hr over 30 Minutes Intravenous Every 8 hours 05/24/13 2334 05/25/13 0854   05/24/13 1930  ciprofloxacin (CIPRO) IVPB 400 mg     400 mg 200 mL/hr over 60 Minutes Intravenous  Once 05/24/13 1905 05/24/13 2137   05/24/13 1915  metroNIDAZOLE (FLAGYL) IVPB 500 mg     500 mg 100 mL/hr over 60 Minutes Intravenous  Once 05/24/13 1905 05/24/13 2254      Medications: Scheduled Meds: . diphenhydrAMINE  12.5 mg Intravenous Once  . enoxaparin (LOVENOX) injection  40 mg Subcutaneous Q24H  . feeding supplement (ENSURE COMPLETE)  237 mL Oral Q24H  . gentamicin  80 mg Intravenous Q8H  . imipenem-cilastatin  500 mg Intravenous Q6H  . pantoprazole  40 mg Oral Daily  . sodium chloride  3 mL Intravenous Q12H    Objective: Weight change:   Intake/Output Summary (Last 24 hours) at 05/30/13 1445 Last data filed at 05/30/13 1300  Gross per 24 hour  Intake 3490.5 ml  Output    350  ml  Net 3140.5 ml   Blood pressure 129/90, pulse 78, temperature 98.3 F (36.8 C), temperature source Oral, resp. rate 16, height 5\' 10"  (1.778 m), weight 210 lb 11.2 oz (95.573 kg), SpO2 99.00%. Temp:  [97.9 F (36.6 C)-98.3 F (36.8 C)] 98.3 F (36.8 C) (10/24 1412) Pulse Rate:  [78] 78 (10/24 1412) Resp:  [12-16] 16 (10/24 1412) BP: (117-129)/(71-90) 129/90 mmHg (10/24 1412) SpO2:  [97 %-99 %] 99 % (10/24 1412)  Physical Exam: General: Alert and awake, oriented x3, not in any acute distress, sitting in bedside chair reading paper HEENT: less  icteric sclera,EOMI, oropharynx clear and without exudate  CVS tachy rate, normal r, no murmur rubs or gallops  Chest: clear to auscultation bilaterally, no wheezing, rales or rhonchi  Abdomen: soft TTP RUQ, nondistended, normal bowel sounds,  Extremities: no clubbing or edema noted bilaterally  Skin: no rashes  Neuro: nonfocal, strength and sensation intact  Lab Results:BMET  Micro Results: Recent Results (from the past 240 hour(s))  CULTURE, BLOOD (ROUTINE X 2)     Status: None   Collection Time    05/24/13  9:45 PM      Result Value Range Status   Specimen Description BLOOD LEFT ANTECUBITAL   Final  Special Requests BOTTLES DRAWN AEROBIC AND ANAEROBIC 5CC   Final   Culture  Setup Time     Final   Value: 05/25/2013 01:21     Performed at [facility]   Culture     Final   Value: ENTEROCOCCUS SPECIES     Note: COMBINATION THERAPY OF HIGH DOSE AMPICILLIN OR VANCOMYCIN, PLUS AN AMINOGLYCOSIDE, IS USUALLY INDICATED FOR SERIOUS ENTEROCOCCAL INFECTIONS.     VIRIDANS STREPTOCOCCUS     Note: Gram Stain Report Called to,Read Back By and Verified With: JENNY BURNS 05/25/13 @ 12:40PM BY RUSCOE A.     Performed at [facility]   Report Status 05/28/2013 FINAL   Final   Organism ID, Bacteria ENTEROCOCCUS SPECIES   Final  CULTURE, BLOOD (ROUTINE X 2)     Status: None   Collection Time    05/24/13 10:00 PM      Result Value  Range Status   Specimen Description BLOOD LEFT HAND   Final   Special Requests BOTTLES DRAWN AEROBIC AND ANAEROBIC 5CC   Final   Culture  Setup Time     Final   Value: 05/25/2013 01:20     Performed at [facility]   Culture     Final   Value: ENTEROCOCCUS SPECIES     Note: SUSCEPTIBILITIES PERFORMED ON PREVIOUS CULTURE WITHIN THE LAST 5 DAYS.     VIRIDANS STREPTOCOCCUS     Note: Susceptibilities performed on previous culture within the last 5 days.     Note: Gram Stain Report Called to,Read Back By and Verified With: JENNY BURNS 05/25/13 @ 12:40PM BY RUSCOE A.   Report Status 05/28/2013 FINAL   Final  CULTURE, BLOOD (ROUTINE X 2)     Status: None   Collection Time    05/26/13  3:27 PM      Result Value Range Status   Specimen Description BLOOD LEFT HAND   Final   Special Requests BOTTLES DRAWN AEROBIC AND ANAEROBIC 5CC   Final   Culture  Setup Time     Final   Value: 05/26/2013 20:42     Performed at [facility]   Culture     Final   Value:        BLOOD CULTURE RECEIVED NO GROWTH TO DATE CULTURE WILL BE HELD FOR 5 DAYS BEFORE ISSUING A FINAL NEGATIVE REPORT     Performed at [facility]   Report Status PENDING   Incomplete  SURGICAL PCR SCREEN     Status: Abnormal   Collection Time    05/26/13  3:30 PM      Result Value Range Status   MRSA, PCR INVALID RESULTS, SPECIMEN SENT FOR CULTURE (*) NEGATIVE Final   Comment: RCRV     A THOMPSON RN 1902 05/26/13 A NAVARRO   Staphylococcus aureus INVALID RESULTS, SPECIMEN SENT FOR CULTURE (*) NEGATIVE Final   Comment:            The Xpert SA Assay (FDA     approved for NASAL specimens     in patients over 7 years of age),     is one component of     a comprehensive surveillance     program.  Test performance has     been validated by [employer] for patients greater     than or equal to 65 year old.     It is not intended     to diagnose infection  nor to     guide or monitor treatment.  CULTURE,  BLOOD (ROUTINE X 2)     Status: None   Collection Time    05/26/13  3:34 PM      Result Value Range Status   Specimen Description BLOOD RIGHT HAND   Final   Special Requests BOTTLES DRAWN AEROBIC ONLY 3CC   Final   Culture  Setup Time     Final   Value: 05/26/2013 20:39     Performed at [facility]   Culture     Final   Value: ENTEROCOCCUS SPECIES     Note: SUSCEPTIBILITIES PERFORMED ON PREVIOUS CULTURE WITHIN THE LAST 5 DAYS.     Note: Gram Stain Report Called to,Read Back By and Verified With: ANNIE H @ 1400 05/27/13 BY KRAWS     Performed at [facility]   Report Status 05/28/2013 FINAL   Final  MRSA CULTURE     Status: None   Collection Time    05/26/13  7:00 PM      Result Value Range Status   Specimen Description NOSE   Final   Special Requests NONE   Final   Culture     Final   Value: NO STAPHYLOCOCCUS AUREUS ISOLATED     Note: NOMRSA     Performed at [facility]   Report Status 05/29/2013 FINAL   Final  CULTURE, BLOOD (ROUTINE X 2)     Status: None   Collection Time    05/27/13  4:00 PM      Result Value Range Status   Specimen Description BLOOD LEFT HAND   Final   Special Requests BOTTLES DRAWN AEROBIC AND ANAEROBIC 10CC   Final   Culture  Setup Time     Final   Value: 05/27/2013 21:01     Performed at [facility]   Culture     Final   Value:        BLOOD CULTURE RECEIVED NO GROWTH TO DATE CULTURE WILL BE HELD FOR 5 DAYS BEFORE ISSUING A FINAL NEGATIVE REPORT     Performed at [facility]   Report Status PENDING   Incomplete  CULTURE, BLOOD (ROUTINE X 2)     Status: None   Collection Time    05/27/13  4:15 PM      Result Value Range Status   Specimen Description BLOOD LEFT ARM   Final   Special Requests BOTTLES DRAWN AEROBIC AND ANAEROBIC 10CC   Final   Culture  Setup Time     Final   Value: 05/27/2013 21:01     Performed at [facility]   Culture     Final   Value:        BLOOD CULTURE RECEIVED NO GROWTH TO  DATE CULTURE WILL BE HELD FOR 5 DAYS BEFORE ISSUING A FINAL NEGATIVE REPORT     Performed at [facility]   Report Status PENDING   Incomplete    Studies/Results: No results found.    Assessment/Plan: Terry Byrd is a 57 y.o. 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 TEE was negative for vegetations.  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 Despite TEE was negative. Recommend to treat as a complicated bacteremia. With a minimum of  two weeks with imipenem and gentamicin from date of first negative blood culture and that we  should consider extending this to 4 weeks for an "endocarditis" regimen. Regardless we are going to want to get surveillance blood cultures 2 weeks post stopping abx. We will have him follow up in the ID clinic in 2 wks.  - he will need BID chem 8, and weekly gent trough. Gent levels to be more frequent if gent dose is changed. Fax results to dr. Orvan Falconer at [phone]. He has follow up appt on Nov 6th at 2pm. At that follow up visit, we will discuss to keep at 2 wk therapy vs. Extending to 4 wks.    LOS: 6 days   Judyann Munson 05/30/2013, 2:45 PM

## 2013-05-30 NOTE — Progress Notes (Signed)
ANTIBIOTIC CONSULT NOTE - FOLLOW UP  Pharmacy Consult for Gentamicin Indication: Enterococcal bacteremia  Allergies  Allergen Reactions  . Codeine     REACTION: nausea/vomiting  . Penicillins Rash    Patient Measurements: Height: 5\' 10"  (177.8 cm) Weight: 210 lb 11.2 oz (95.573 kg) IBW/kg (Calculated) : 73 Adjusted Body Weight: 82 kg  Vital Signs: Temp: 98.3 F (36.8 C) (10/24 1412) Temp src: Oral (10/24 1412) BP: 129/90 mmHg (10/24 1412) Pulse Rate: 78 (10/24 1412) Intake/Output from previous day: 10/23 0701 - 10/24 0700 In: 3400.5 [P.O.:1200; I.V.:1650.5; IV Piggyback:550] Out: 750 [Urine:750] Intake/Output from this shift: Total I/O In: 840 [P.O.:840] Out: -   Labs:  Recent Labs  05/28/13 0240 05/29/13 0546 05/30/13 0521  WBC 7.3 7.3  --   HGB 11.9* 12.4*  --   PLT 146* 189  --   CREATININE 0.83 0.82 0.82   Estimated Creatinine Clearance: 115.3 ml/min (by C-G formula based on Cr of 0.82).  Recent Labs  05/28/13 1703 05/28/13 2320  GENTTROUGH  --  0.4*  GENTPEAK 3.3*  --      Microbiology: Recent Results (from the past 720 hour(s))  URINE CULTURE     Status: None   Collection Time    05/12/13  4:37 PM      Result Value Range Status   Colony Count NO GROWTH   Final   Organism ID, Bacteria NO GROWTH   Final  CULTURE, BLOOD (ROUTINE X 2)     Status: None   Collection Time    05/24/13  9:45 PM      Result Value Range Status   Specimen Description BLOOD LEFT ANTECUBITAL   Final   Special Requests BOTTLES DRAWN AEROBIC AND ANAEROBIC 5CC   Final   Culture  Setup Time     Final   Value: 05/25/2013 01:21     Performed at [facility]   Culture     Final   Value: ENTEROCOCCUS SPECIES     Note: COMBINATION THERAPY OF HIGH DOSE AMPICILLIN OR VANCOMYCIN, PLUS AN AMINOGLYCOSIDE, IS USUALLY INDICATED FOR SERIOUS ENTEROCOCCAL INFECTIONS.     VIRIDANS STREPTOCOCCUS     Note: Gram Stain Report Called to,Read Back By and Verified With: JENNY BURNS  05/25/13 @ 12:40PM BY RUSCOE A.     Performed at [facility]   Report Status 05/28/2013 FINAL   Final   Organism ID, Bacteria ENTEROCOCCUS SPECIES   Final  CULTURE, BLOOD (ROUTINE X 2)     Status: None   Collection Time    05/24/13 10:00 PM      Result Value Range Status   Specimen Description BLOOD LEFT HAND   Final   Special Requests BOTTLES DRAWN AEROBIC AND ANAEROBIC 5CC   Final   Culture  Setup Time     Final   Value: 05/25/2013 01:20     Performed at [facility]   Culture     Final   Value: ENTEROCOCCUS SPECIES     Note: SUSCEPTIBILITIES PERFORMED ON PREVIOUS CULTURE WITHIN THE LAST 5 DAYS.     VIRIDANS STREPTOCOCCUS     Note: Susceptibilities performed on previous culture within the last 5 days.     Note: Gram Stain Report Called to,Read Back By and Verified With: JENNY BURNS 05/25/13 @ 12:40PM BY RUSCOE A.   Report Status 05/28/2013 FINAL   Final  CULTURE, BLOOD (ROUTINE X 2)     Status: None   Collection Time    05/26/13  3:27 PM      Result Value Range Status   Specimen Description BLOOD LEFT HAND   Final   Special Requests BOTTLES DRAWN AEROBIC AND ANAEROBIC 5CC   Final   Culture  Setup Time     Final   Value: 05/26/2013 20:42     Performed at [facility]   Culture     Final   Value:        BLOOD CULTURE RECEIVED NO GROWTH TO DATE CULTURE WILL BE HELD FOR 5 DAYS BEFORE ISSUING A FINAL NEGATIVE REPORT     Performed at [facility]   Report Status PENDING   Incomplete  SURGICAL PCR SCREEN     Status: Abnormal   Collection Time    05/26/13  3:30 PM      Result Value Range Status   MRSA, PCR INVALID RESULTS, SPECIMEN SENT FOR CULTURE (*) NEGATIVE Final   Comment: RCRV     A THOMPSON RN 1902 05/26/13 A NAVARRO   Staphylococcus aureus INVALID RESULTS, SPECIMEN SENT FOR CULTURE (*) NEGATIVE Final   Comment:            The Xpert SA Assay (FDA     approved for NASAL specimens     in patients over 77 years of age),     is one component  of     a comprehensive surveillance     program.  Test performance has     been validated by [employer] for patients greater     than or equal to 52 year old.     It is not intended     to diagnose infection nor to     guide or monitor treatment.  CULTURE, BLOOD (ROUTINE X 2)     Status: None   Collection Time    05/26/13  3:34 PM      Result Value Range Status   Specimen Description BLOOD RIGHT HAND   Final   Special Requests BOTTLES DRAWN AEROBIC ONLY 3CC   Final   Culture  Setup Time     Final   Value: 05/26/2013 20:39     Performed at [facility]   Culture     Final   Value: ENTEROCOCCUS SPECIES     Note: SUSCEPTIBILITIES PERFORMED ON PREVIOUS CULTURE WITHIN THE LAST 5 DAYS.     Note: Gram Stain Report Called to,Read Back By and Verified With: ANNIE H @ 1400 05/27/13 BY KRAWS     Performed at [facility]   Report Status 05/28/2013 FINAL   Final  MRSA CULTURE     Status: None   Collection Time    05/26/13  7:00 PM      Result Value Range Status   Specimen Description NOSE   Final   Special Requests NONE   Final   Culture     Final   Value: NO STAPHYLOCOCCUS AUREUS ISOLATED     Note: NOMRSA     Performed at [facility]   Report Status 05/29/2013 FINAL   Final  CULTURE, BLOOD (ROUTINE X 2)     Status: None   Collection Time    05/27/13  4:00 PM      Result Value Range Status   Specimen Description BLOOD LEFT HAND   Final   Special Requests BOTTLES DRAWN AEROBIC AND ANAEROBIC 10CC   Final   Culture  Setup Time     Final   Value:  05/27/2013 21:01     Performed at [facility]     Final   Value:        BLOOD CULTURE RECEIVED NO GROWTH TO DATE CULTURE WILL BE HELD FOR 5 DAYS BEFORE ISSUING A FINAL NEGATIVE REPORT     Performed at [facility]   Report Status PENDING   Incomplete  CULTURE, BLOOD (ROUTINE X 2)     Status: None   Collection Time    05/27/13  4:15 PM      Result Value Range Status   Specimen  Description BLOOD LEFT ARM   Final   Special Requests BOTTLES DRAWN AEROBIC AND ANAEROBIC 10CC   Final   Culture  Setup Time     Final   Value: 05/27/2013 21:01     Performed at [facility]   Culture     Final   Value:        BLOOD CULTURE RECEIVED NO GROWTH TO DATE CULTURE WILL BE HELD FOR 5 DAYS BEFORE ISSUING A FINAL NEGATIVE REPORT     Performed at [facility]   Report Status PENDING   Incomplete    Anti-infectives   Start     Dose/Rate Route Frequency Ordered Stop   05/27/13 1600  gentamicin (GARAMYCIN) IVPB 80 mg     80 mg 100 mL/hr over 30 Minutes Intravenous Every 8 hours 05/27/13 1538     05/27/13 0800  vancomycin (VANCOCIN) 1,250 mg in sodium chloride 0.9 % 250 mL IVPB  Status:  Discontinued     1,250 mg 166.7 mL/hr over 90 Minutes Intravenous Every 8 hours 05/27/13 0340 05/28/13 0948   05/25/13 1200  imipenem-cilastatin (PRIMAXIN) 500 mg in sodium chloride 0.9 % 100 mL IVPB     500 mg 200 mL/hr over 30 Minutes Intravenous 4 times per day 05/25/13 0854     05/25/13 0030  vancomycin (VANCOCIN) IVPB 1000 mg/200 mL premix  Status:  Discontinued     1,000 mg 200 mL/hr over 60 Minutes Intravenous Every 8 hours 05/25/13 0000 05/27/13 0340   05/24/13 2359  imipenem-cilastatin (PRIMAXIN) 500 mg in sodium chloride 0.9 % 100 mL IVPB  Status:  Discontinued     500 mg 200 mL/hr over 30 Minutes Intravenous Every 8 hours 05/24/13 2334 05/25/13 0854   05/24/13 1930  ciprofloxacin (CIPRO) IVPB 400 mg     400 mg 200 mL/hr over 60 Minutes Intravenous  Once 05/24/13 1905 05/24/13 2137   05/24/13 1915  metroNIDAZOLE (FLAGYL) IVPB 500 mg     500 mg 100 mL/hr over 60 Minutes Intravenous  Once 05/24/13 1905 05/24/13 2254      Assessment: 57 y.o. 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 TEE negative for vegetations.  ID recommends to treat as a  complicated bacteremia w/ minimum 2 weeks IV antibiotics from date first negative blood cultures.     10/18 >>cipro >>x1 ED 10/18 >>flagyl >>x1 ED  10/19>>vancomcyin>> 10/22 10/19>>primaxin>> 10/21>>gentamicin>>  Tmax: Afeb WBCs: improved to wnl Renal: Scr stable, CrCl CG 114  10/22 Gent Peak after 4th dose 17:00 = 3.3 (goal 3-4) 10/22 Gent Tr before 5th dose 23:30 = 0.4 (goal <1)  10/18 blood: 2 of 2 enterococcus (sens amp, vanc) 10/20 blood: 1 of 2 enterococcus (sens amp, vanc) 10/21 blood: 2 of 2 ngtd  Goal of Therapy:  Gentamicin peak level 3-4 mcg/ml  Gentamicin trough level < 1 mcg/ml  Plan:  Continue Gentamicin 80mg  IV q8h, Primaxin 500mg  IV q 6h Measure antibiotic drug levels at steady state Follow up culture results    Arelis Neumeier, Riccardo Dubin, PharmD 05/30/2013,3:35 PM

## 2013-05-30 NOTE — Progress Notes (Signed)
[facility]  Patient Status:   New patient to [facility] services this admission  [facility] is providing the following services:  Terry Byrd will receive [facility] RN and Home [facility]fusion Pharm[facility] services for home IV Abx.  [facility] Infusion Coordinator will support in hospital teaching regarding IV Abx administration to support independence at home. [facility] team will follow and support transition home when deemed appropriate by MD.  If patient discharges after hours, please call [phone].   [name] 05/30/2013, 9:54 AM

## 2013-05-30 NOTE — Progress Notes (Signed)
NUTRITION FOLLOW UP  Intervention:   Recommend Low Fat Diet Provide Ensure Complete once daily  Nutrition Dx:   Inadequate oral intake related to abdominal pain and frequent hospitalizations as evidenced by >5% wt loss in less than one month and current NPO status; ongoing  Goal:   Pt to meet >/= 90% of their estimated nutrition needs; not met  Monitor:   Diet advancement; bland diet 10/23 (Full liquid 10/22) PO intake; 0-100% of meals Weight; no new wt Labs; low potassium, low albumin  Assessment:   57 year old male presents to the emergency department with a one-day history of worsening abdominal pain in the epigastric and periumbilical region. The patient was recently discharged from the hospital on 05/22/2013 after admission for workup of abdominal pain. Abdominal ultrasound on 05/21/2013 revealed dilated biliary tree with gallbladder sludge. The patient's hepatic enzymes and bilirubin were elevated at that time. The patient underwent ERCP on 05/21/2013 during which a stent was placed in the common bile duct. Endoscopic ultrasound was performed on 05/22/2013. During the ultrasound, the patient's pancreatic parenchyma and ductal parenchyma suggested chronic pancreatitis. The patient was discharged home with Norco on 05/22/13. On the morning of this admission, the patient has significant increase of his abdominal pain. He denied any fevers or chills at home, but the patient had a temperature of 102.28F in the ED.  10/20: Pt in progress of meeting in room at time of first visit and being brought down for procedure at time of second visit. At previous admission pt reported 15 lbs wt loss in 3 weeks due to abdominal pain. Pt's weight dropped from 220 lbs to 206 lbs in less than 3 weeks per weight history. Pt was NPO during previous admission and has been NPO or on clear liquids for the past 3 days.    10/24: Pt refused to talk to RD at this time. RD spoke with pt's wife. Wife reports that pt  has been complaining of abdominal pain. She reports pt ate a greasy grilled cheese sandwich and macaroni & cheese and she is wondering if he is allowed those foods. Recommended pt choose low fat menu items and low fat dairy. Wife thinks pt has lost an additional 4 lbs since admission; no new wt in pt's chart.  Per MD note: Biliary obstruction, s/p ERCP with biliary stent exchange 10/20. LFTs continue to improve. Etiology of stricture not clear but possibly secondary to chronic pancreatitis. Malignancy not excluded with certainty. Patient has appointment at [facility] Monday for further evaluation.     Height: Ht Readings from Last 1 Encounters:  05/24/13 5\' 10"  (1.778 m)    Weight Status:   Wt Readings from Last 1 Encounters:  05/24/13 210 lb 11.2 oz (95.573 kg)    Re-estimated needs:  Kcal: 2000-2200  Protein: 90-110 grams  Fluid: 2.6 L/day  Skin: jaundice  Diet Order: Parke Simmers   Intake/Output Summary (Last 24 hours) at 05/30/13 1429 Last data filed at 05/30/13 1300  Gross per 24 hour  Intake 3250.5 ml  Output    350 ml  Net 2900.5 ml    Last BM: PTA   Labs:   Recent Labs Lab 05/28/13 0240 05/29/13 0546 05/30/13 0521  NA 132* 134* 136  K 3.6 3.5 3.2*  CL 100 100 101  CO2 26 24 26   BUN 11 10 11   CREATININE 0.83 0.82 0.82  CALCIUM 8.5 8.9 8.6  GLUCOSE 95 98 114*    CBG (last 3)  No results found for this  basename: GLUCAP,  in the last 72 hours  Scheduled Meds: . diphenhydrAMINE  12.5 mg Intravenous Once  . enoxaparin (LOVENOX) injection  40 mg Subcutaneous Q24H  . gentamicin  80 mg Intravenous Q8H  . imipenem-cilastatin  500 mg Intravenous Q6H  . pantoprazole  40 mg Oral Daily  . sodium chloride  3 mL Intravenous Q12H    Continuous Infusions: . sodium chloride 75 mL/hr at 05/29/13 1925  . sodium chloride 20 mL/hr (05/27/13 1730)    Ian Malkin RD, LDN Inpatient Clinical Dietitian Pager: [phone] After Hours Pager: [phone]

## 2013-05-30 NOTE — Progress Notes (Addendum)
[facility] Progress Note   Subjective  Still having some abdominal discomfort with 2 Vicodin but pain manageable.    Objective   Vital signs in last 24 hours: Temp:  [97.5 F (36.4 C)-98.2 F (36.8 C)] 98.2 F (36.8 C) (10/24 0621) Pulse Rate:  [78-91] 78 (10/24 0621) Resp:  [12-16] 14 (10/24 0621) BP: (117-122)/(71-82) 121/71 mmHg (10/24 0621) SpO2:  [97 %-98 %] 98 % (10/24 0621) Last BM Date: 05/22/13 General:    Pleasant white male in NAD Heart:  Regular rate and rhythm Abdomen:  Soft, nontender and nondistended. Normal bowel sounds. Extremities:  Without edema. Neurologic:  Alert and oriented,  grossly normal neurologically. Psych:  Cooperative. Normal mood and affect.  Lab Results:  Recent Labs  05/28/13 0240 05/29/13 0546  WBC 7.3 7.3  HGB 11.9* 12.4*  HCT 34.8* 36.4*  PLT 146* 189   BMET  Recent Labs  05/28/13 0240 05/29/13 0546 05/30/13 0521  NA 132* 134* 136  K 3.6 3.5 3.2*  CL 100 100 101  CO2 26 24 26   GLUCOSE 95 98 114*  BUN 11 10 11   CREATININE 0.83 0.82 0.82  CALCIUM 8.5 8.9 8.6   LFT  Recent Labs  05/28/13 0237  05/30/13 0521  PROT 5.4*  < > 5.6*  ALBUMIN 2.2*  < > 2.2*  AST 245*  < > 145*  ALT 379*  < > 261*  ALKPHOS 401*  < > 458*  BILITOT 4.6*  < > 3.0*  BILIDIR 3.4*  --   --   IBILI 1.2*  --   --   < > = values in this interval not displayed.    Assessment / Plan:   1. Biliary obstruction, s/p ERCP with biliary stent exchange 10/20. LFTs continue to improve. Etiology of stricture not clear but possibly secondary to chronic pancreatitis. Malignancy not excluded with certainty. Patient has appointment at  [facility] Monday for further evaluation.    2. Acute cholangitis, improving. Enterococcus bacteremia likely secondary to #1. TEE negative for endocarditis. ID has recommended IV antibiotics for additional two weeks at home. Home Health nurse here now to see patient. He will be PICC prior to discharge.   3. Chronic  pancreatitis by EUS.  No evidence for pancreatic insufficiency at present.  LOS: 6 days   Willette Cluster  05/30/2013, 9:54 AM     Attending physician's note   I have taken an interval history, reviewed the chart and examined the patient. I agree with the Advanced Practitioner's note, impression and recommendations. Long conversation with pt and pts wife. Discussed several aspects of his care and attempted to answer all their questions. Biliary obstruction and cholangitis steadily improving. OK for discharge from GI standpoint once home IV antibiotics arranged, PICC line placed and pain adequately controlled on a PO regimen. ID managing antibiotics. Hospitalists managing pain. OP follow up at [facility] on Monday and with Dr. Yancey Flemings. GI available if needed.  Venita Lick. Russella Dar, MD [facility]

## 2013-05-31 DIAGNOSIS — R7881 Bacteremia: Secondary | ICD-10-CM | POA: Diagnosis present

## 2013-05-31 LAB — COMPREHENSIVE METABOLIC PANEL WITH GFR
ALT: 258 U/L — ABNORMAL HIGH (ref 0–53)
AST: 191 U/L — ABNORMAL HIGH (ref 0–37)
Albumin: 2.3 g/dL — ABNORMAL LOW (ref 3.5–5.2)
Alkaline Phosphatase: 546 U/L — ABNORMAL HIGH (ref 39–117)
BUN: 9 mg/dL (ref 6–23)
CO2: 26 meq/L (ref 19–32)
Calcium: 9 mg/dL (ref 8.4–10.5)
Chloride: 102 meq/L (ref 96–112)
Creatinine, Ser: 0.9 mg/dL (ref 0.50–1.35)
GFR calc Af Amer: >90 mL/min
GFR calc non Af Amer: >90 mL/min
Glucose, Bld: 100 mg/dL — ABNORMAL HIGH (ref 70–99)
Potassium: 3.8 meq/L (ref 3.5–5.1)
Sodium: 136 meq/L (ref 135–145)
Total Bilirubin: 3.2 mg/dL — ABNORMAL HIGH (ref 0.3–1.2)
Total Protein: 5.9 g/dL — ABNORMAL LOW (ref 6.0–8.3)

## 2013-05-31 MED ORDER — PANTOPRAZOLE SODIUM 40 MG PO TBEC: 40.0000 mg | DELAYED_RELEASE_TABLET | Freq: Every day | ORAL | Status: AC

## 2013-05-31 MED ORDER — SODIUM CHLORIDE 0.9 % IV SOLN: 500.0000 mg | Freq: Four times a day (QID) | INTRAVENOUS | Status: AC

## 2013-05-31 MED ORDER — HYDROXYZINE HCL 10 MG PO TABS: 10.0000 mg | ORAL_TABLET | Freq: Three times a day (TID) | ORAL | Status: AC | PRN

## 2013-05-31 MED ORDER — HEPARIN SOD (PORK) LOCK FLUSH 100 UNIT/ML IV SOLN
250.0000 [IU] | INTRAVENOUS | Status: AC | PRN
  Administered 2013-05-31: 250 [IU]

## 2013-05-31 MED ORDER — SODIUM CHLORIDE 0.9 % IJ SOLN: 10.0000 mL | Freq: Two times a day (BID) | INTRAMUSCULAR | Status: DC

## 2013-05-31 MED ORDER — OXYCODONE HCL ER 15 MG PO T12A: 15.0000 mg | EXTENDED_RELEASE_TABLET | Freq: Two times a day (BID) | ORAL | Status: AC

## 2013-05-31 MED ORDER — HYDROCODONE-ACETAMINOPHEN 5-325 MG PO TABS: 2.0000 | ORAL_TABLET | ORAL | Status: AC | PRN

## 2013-05-31 MED ORDER — SODIUM CHLORIDE 0.9 % IV SOLN: 500.0000 mg | Freq: Four times a day (QID) | INTRAVENOUS | Status: DC

## 2013-05-31 MED ORDER — IMIPENEM-CILASTATIN 500 MG IV SOLR: 500.0000 mg | Freq: Four times a day (QID) | INTRAVENOUS | Status: DC

## 2013-05-31 MED ORDER — GENTAMICIN IN SALINE 1.6-0.9 MG/ML-% IV SOLN: 80.0000 mg | Freq: Three times a day (TID) | INTRAVENOUS | Status: DC

## 2013-05-31 MED ORDER — POLYETHYLENE GLYCOL 3350 17 G PO PACK: 17.0000 g | PACK | Freq: Every day | ORAL | Status: AC | PRN

## 2013-05-31 MED ORDER — LORAZEPAM 0.5 MG PO TABS: 0.5000 mg | ORAL_TABLET | Freq: Three times a day (TID) | ORAL | Status: AC | PRN

## 2013-05-31 MED ORDER — GENTAMICIN IN SALINE 1.6-0.9 MG/ML-% IV SOLN: 80.0000 mg | Freq: Three times a day (TID) | INTRAVENOUS | Status: AC

## 2013-05-31 MED ORDER — SODIUM CHLORIDE 0.9 % IJ SOLN
10.0000 mL | INTRAMUSCULAR | Status: DC | PRN
  Administered 2013-05-31: 20 mL

## 2013-05-31 NOTE — Progress Notes (Signed)
Pt discharged to home. DC instructions given with wife at bedside. No concerns voiced. Prescriptions also given. Left unit in wheelchair pushed by nurse tech accompanied by wife. Left in good condition. Vwilliams,rn.

## 2013-05-31 NOTE — Progress Notes (Signed)
Peripherally Inserted Central Catheter/Midline Placement  The IV Nurse has discussed with the patient and/or persons authorized to consent for the patient, the purpose of this procedure and the potential benefits and risks involved with this procedure.  The benefits include less needle sticks, lab draws from the catheter and patient may be discharged home with the catheter.  Risks include, but not limited to, infection, bleeding, blood clot (thrombus formation), and puncture of an artery; nerve damage and irregular heat beat.  Alternatives to this procedure were also discussed.  PICC/Midline Placement Documentation  PICC / Midline Double Lumen 05/31/13 PICC Right Basilic 42 cm 1 cm (Active)       Ethelda Chick 05/31/2013, 9:08 AM

## 2013-05-31 NOTE — Discharge Summary (Signed)
Physician Discharge Summary  Terry Byrd GEX:[id number] DOB: September 06, 1955 DOA: 05/24/2013  PCP: [name], MD  Admit date: 05/24/2013 Discharge date: 05/31/2013  Time spent: 45 minutes  Recommendations for Outpatient Follow-up:  1.  GI in 1 week 2. Dr.Pappas at [facility] On Monday 3. LABS-Biweekly Bmet and weekly Gent Trough to be faxed to Dr.Campbell's office at [phone]  Discharge Diagnoses:    Acute Cholangitis    Enterococcus Bacteremia   Obstruction of bile duct   Elevated LFTs   CBD obstruction   CBD stricture   Chronic pancreatitis by EUS     Discharge Condition: stable  Diet recommendation: stable  Filed Weights   05/24/13 2346  Weight: 95.573 kg (210 lb 11.2 oz)    History of present illness:  57 year old male presents to the emergency department with a one-day history of worsening abdominal pain in the epigastric and periumbilical region. The patient was recently discharged from the hospital on 05/22/2013 after admission for workup of abdominal pain. Abdominal ultrasound on 05/21/2013 revealed dilated biliary tree with gallbladder sludge. The patient's hepatic enzymes and bilirubin were elevated at that time. The patient underwent ERCP on 05/21/2013 during which a stent was placed in the common bile duct. Endoscopic ultrasound was performed on 05/22/2013. During the ultrasound, the patient's pancreatic parenchyma and ductal parenchyma suggested chronic pancreatitis. The patient was discharged home with Norco on 05/22/13. On the morning of this admission, the patient has significant increase of his abdominal pain. As result he came to the ED for further evaluation. He denied any fevers or chills at home, but the patient had a temperature of 102.32F in the ED. He denied any chest discomfort, shortness breath, vomiting, diarrhea, hematochezia, melena, dysuria, hematuria.  In the ED, the patient was given 6 mg of hydromorphone With only 10-20% relief of his abdominal  pain. AST was 451, ALT 66, alk phosphatase 405 , total bilirubin 7.5, and lipase 66. He was hemodynamically stable. CT of the abdomen and pelvis showed increasing intra-and extrahepatic ductal dilatation since his CAT scan on 05/23/2013. His biliary stent has also migrated distally with tip remaining in the mid to distal common bile duct. There was mild distended gallbladder with gallbladder wall thickening. The patient was given 1 L normal saline and started on Cipro and Flagyl. WBC was 10.0. BMP was unremarkable. Lactic acid 1.6. The patient was noted to have temperature 100.5, tachycardia 115    Hospital Course:  Enterococcus bacteremia x2 sets -from 10/18 and 10/20, but also had obstruction during time of repeat cultures -currently on Imipenem/gent per ID recommendations -2D ECHO with no vegetations, TEE negative for Endocarditis  -Bacteremia secondary to acute cholangitis  -Repeat Blood cx from 10/21 negative so far  -PICC placed 10/25 -DC home on regimen of Imipenem/Gen for atleast 2 weeks starting 10/21(first negative cultures) till 11/6 upon FU with Dr.Campbell potentially could be extended to 4weeks per ID recommendations upon follow up -needs Biweekly bmet and weekly Gent trough  -FU with Dr.Campbell on 11/6   Acute cholangitis  -improving  -Secondary to biliary stent migration and obstruction of CBD  -s/p ERCP/dilation and stent exchange 10/20  -LFTs/bili improving, ALP lagging some  -tolerating advanced diet  -stable for discharge per GI  Abd pain  -improved  -started on Po Oxycontin yesterday, pain better controlled now, also getting 1-2tabs of Norco PRN   CBD stricture  -Cytologies from the CBD brushings were negative, FNA showed atypical cells. Ca19-9 normal -to FU with GI Surgery at [facility]  on Monday   Procedure --s/p ERCP/dilation and stent exchange 10/20 by Dr.Stark   Consultations:  GI [place]  Infectious disease  Discharge Exam: Filed Vitals:   05/31/13  1348  BP: 112/56  Pulse: 79  Temp: 97.8 F (36.6 C)  Resp: 18    General: AAOx3 Cardiovascular: S1S2/RRR Respiratory: CTAB  Discharge Instructions   Future Appointments Provider Department Dept Phone   06/12/2013 2:00 PM Cliffton Asters, MD [facility] for Infectious Disease [phone]       Medication List    STOP taking these medications       acetaminophen 500 MG tablet  Commonly known as:  TYLENOL     cyclobenzaprine 10 MG tablet  Commonly known as:  FLEXERIL      TAKE these medications       calcium carbonate 500 MG chewable tablet  Commonly known as:  TUMS - dosed in mg elemental calcium  Chew 1 tablet by mouth daily as needed for heartburn.     gentamicin 1.6-0.9 MG/ML-%  Commonly known as:  GARAMYCIN  - Inject 50 mLs (80 mg total) into the vein every 8 (eight) hours. Till 11/6, dosing to be managed by infusion co-pharmacist from HH([facility])  - Needs Biwekly Bmet and Gentamicin trough faxed to dr.Campbells office at [phone]     HYDROcodone-acetaminophen 5-325 MG per tablet  Commonly known as:  NORCO/VICODIN  Take 2 tablets by mouth every 4 (four) hours as needed for pain.     hydrOXYzine 10 MG tablet  Commonly known as:  ATARAX/VISTARIL  Take 1 tablet (10 mg total) by mouth 3 (three) times daily as needed for itching.     LORazepam 0.5 MG tablet  Commonly known as:  ATIVAN  Take 1 tablet (0.5 mg total) by mouth every 8 (eight) hours as needed for anxiety.     ondansetron 4 MG tablet  Commonly known as:  ZOFRAN  Take 1 tablet (4 mg total) by mouth every 6 (six) hours as needed for nausea.     OxyCODONE 15 mg T12a 12 hr tablet  Commonly known as:  OXYCONTIN  Take 1 tablet (15 mg total) by mouth every 12 (twelve) hours. For 7-10days     pantoprazole 40 MG tablet  Commonly known as:  PROTONIX  Take 1 tablet (40 mg total) by mouth daily.     polyethylene glycol packet  Commonly known as:  MIRALAX / GLYCOLAX  Take 17 g by  mouth daily as needed.     sodium chloride 0.9 % SOLN 100 mL with imipenem-cilastatin 500 MG SOLR 500 mg  Inject 500 mg into the vein every 6 (six) hours. Till 11/6, Dosing to be managed by Infusion, co-pharmacist from [facility]       Allergies  Allergen Reactions  . Codeine     REACTION: nausea/vomiting  . Penicillins Rash       Follow-up Information   Follow up with Yancey Flemings, MD. Schedule an appointment as soon as possible for a visit in 1 week.   Specialty:  Gastroenterology   Contact information:   [address] [place] [address] [phone]       Follow up with Cliffton Asters, MD On 06/12/2013.   Specialty:  Infectious Diseases   Contact information:   [address] [place] [address] [phone]       Follow up with [facility]-Biweekly Bmet and Weekly Gent Trough. (Needs IV Imipenem and Gentamicin till 11/6,  may be more pending Dr.Campbell's assessment, Bmet and Gent Trough to be faxed to [facility] at [phone].)       Follow up with [facility]. [facility] RN, IV antibiotics)    Contact information:   [address] [place] [address] [phone]        The results of significant diagnostics from this hospitalization (including imaging, microbiology, ancillary and laboratory) are listed below for reference.    Significant Diagnostic Studies: Ct Abdomen Pelvis W Wo Contrast  05/23/2013   CLINICAL DATA:  Evaluate for pancreas mass  EXAM: CT ABDOMEN AND PELVIS WITHOUT AND WITH CONTRAST  TECHNIQUE: Multidetector CT imaging of the abdomen and pelvis was performed without contrast material in one or both body regions, followed by contrast material(s) and further sections in one or both body regions.  CONTRAST:  OMNIPAQUE IOHEXOL 350 MG/ML SOLN  COMPARISON:  05/14/2013  FINDINGS: The lung bases are clear.  Interval placement of stress set there is a fluid attenuating structure within the periphery of  the right hepatic lobe measuring 1 cm. The gallbladder wall stress set there is enhancement of the gallbladder wall which appears mildly prominent. Interval placement of common bile duct stent with decompression of dilated intrahepatic ducts. Pneumobilia is identified compatible with biliary patency. The pancreatic duct has a normal caliber. The uncinate process demonstrates mild hypoenhancement when compared with the pancreatic head, body and tail. The fat plane between the uncinate process in the SMA appears decreased, image 45/ series 5. No discrete mass however is identified. The spleen is normal.  The adrenal glands are both unremarkable. Normal appearance of the kidneys. The urinary bladder appears normal. The prostate gland and seminal vesicles are unremarkable.  There is a normal caliber of the abdominal aorta. No upper abdominal adenopathy identified. No pelvic or inguinal adenopathy identified.  The stomach and small bowel loops have a normal course and caliber. The proximal colon appears normal. Multiple distal colonic diverticula identified.  Review of the visualized osseous structures demonstrates no aggressive lytic or sclerotic bone lesion.  IMPRESSION: 1. There is relative hypoenhancement of the uncinate process of the pancreas when compared with the head, body and tail of pancreas. Additionally, there is loss of the fat plane between the uncinate process of the pancreas and the superior mesenteric artery, image 42/series 5. These may be indirect findings of adenocarcinoma of the pancreas. However, no discrete mass however is identified.  2. There is no upper abdominal adenopathy noted.  3. Right hepatic lobe cyst.   Electronically Signed   By: Signa Kell M.D.   On: 05/23/2013 15:38   Ct Abdomen Pelvis Wo Contrast  05/14/2013   CLINICAL DATA:  Lower abdominal pain for the past 2 weeks. Decreased appetite.  EXAM: CT ABDOMEN AND PELVIS WITHOUT CONTRAST  TECHNIQUE: Multidetector CT imaging of the  abdomen and pelvis was performed following the standard protocol without intravenous contrast.  COMPARISON:  No priors.  FINDINGS: Lung Bases: 6 mm ground-glass attenuation nodule in the left lower lobe posterior laterally (image 3 of series 3). 1.9 x 1.1 cm ground-glass in relation nodule in the right lower lobe (image 5 of series 3). 9 mm ground-glass attenuation nodule in the medial aspect of the right lower lobe (image 11 of series 3).  Abdomen/Pelvis: Heterogeneous low attenuation throughout the hepatic parenchyma, compatible with heterogeneous hepatic steatosis. 9 mm low-attenuation lesion in segment 4B of the liver is incompletely characterized on today's CT examination, but favored to represent  a tiny cyst. The appearance of the gallbladder, pancreas, spleen, bilateral kidneys and bilateral adrenal glands is unremarkable.  No significant volume of ascites. No pneumoperitoneum. No pathologic distention of small bowel. No definite lymphadenopathy identified within the abdomen or pelvis on today's non contrast CT examination. Normal appendix. Extensive colonic diverticulosis, particularly of the sigmoid colon, without surrounding inflammatory changes to suggest an acute diverticulitis at this time. Prostate gland and urinary bladder are unremarkable in appearance.  Musculoskeletal: There are no aggressive appearing lytic or blastic lesions noted in the visualized portions of the skeleton.  IMPRESSION: 1. No acute findings in the abdomen or pelvis to account for the patient's symptoms. 2. There is severe diverticulosis, particularly of the sigmoid colon, but no definite surrounding inflammatory changes to suggest an acute diverticulitis at this time. 3. Multiple ground-glass attenuation nodules throughout the lung bases bilaterally, largest of which is 1.9 x 1.1 cm in the right lower lobe. Initial follow-up by chest CT without contrast is recommended in 3 months to confirm persistence. This recommendation  follows the consensus statement: Recommendations for the Management of Subsolid Pulmonary Nodules Detected at CT: A Statement from the Fleischner Society as published in Radiology 2013; 266:304-317. 4. Hepatic steatosis. 5. Additional incidental findings, as above.   Electronically Signed   By: Trudie Reed M.D.   On: 05/14/2013 16:21   Dg Chest 2 View  05/25/2013   CLINICAL DATA:  Increase in chest pain and shortness of Breath.  EXAM: CHEST  2 VIEW  COMPARISON:  05/24/2013  FINDINGS: On the lateral view, there is relative increased opacity posteriorly near the lung base. This may reflect a small left medial lower lobe infiltrate. It may be atelectasis accentuated by respiratory motion.  The lungs are otherwise clear. No pleural effusion or pneumothorax.  The heart, mediastinum and hila are unremarkable.  IMPRESSION: 1. Left medial lung base opacity. This could reflect infiltrate or atelectasis. It is best visualized on the lateral view. 2. No other abnormalities.   Electronically Signed   By: Amie Portland M.D.   On: 05/25/2013 12:32   Dg Abd 1 View  05/12/2013   CLINICAL DATA:  Abdominal and back pain.  EXAM: ABDOMEN - 1 VIEW  COMPARISON:  None.  FINDINGS: The bowel gas pattern is normal. A 4 mm radiodensity is seen in the right hemipelvis, overlying the expected location of the distal right ureter. This is suspicious for small distal right ureteral calculus. No other radiopaque calculi identified.  IMPRESSION: Suspect 4 mm distal right ureteral calculus. Consider abdomen pelvis CT without contrast for further evaluation if clinically warranted.   Electronically Signed   By: Myles Rosenthal M.D.   On: 05/12/2013 16:44   Ct Chest W Contrast  05/21/2013   ADDENDUM REPORT: 05/21/2013 11:05  ADDENDUM: Upon further review, there is dilatation of the common bile duct and mild intrahepatic biliary ductal dilatation. This is better demonstrated on subsequent ultrasound 05/21/2013.   Electronically Signed   By:  Trudie Reed M.D.   On: 05/21/2013 11:05   05/21/2013   CLINICAL DATA:  Lung nodules  EXAM: CT CHEST WITH CONTRAST  TECHNIQUE: Multidetector CT imaging of the chest was performed during intravenous contrast administration.  CONTRAST:  80mL OMNIPAQUE IOHEXOL 300 MG/ML  SOLN  COMPARISON:   Healthcare abdomen and pelvis CT exam dated 05/14/2013.  FINDINGS: There is no axillary lymphadenopathy. Upper normal 7 mm short axis high right peritracheal lymph node is identified. No mediastinal or hilar lymphadenopathy. Heart size is  normal. No pericardial or pleural effusion.  Lung windows show a 9 x 16 sub solid right lower lobe this may need pulmonary nodule on image 35. The 7 mm left lower lobe pulmonary nodule on image 35 is stable in the interval. 9 mm right lower lobe posteromedial nodule (image 40) is also stable  Subsegmental atelectasis is seen in the posterior right lower lobe.  Bone windows reveal no worrisome lytic or sclerotic osseous lesions. Images which include the upper abdomen show intra and extrahepatic biliary duct dilatation, as before, but this is incompletely visualized.  IMPRESSION: Multiple pulmonary nodules as described previously. The 3 nodules or seen in the lung bases on the previous study represent the only 3 nodule seen in the lungs on the CT of the chest. Initial followup CT at 3 months recommended to confirm persistence. Adenocarcinoma cannot be excluded. This recommendation follows the consensus statement: Recommendations for the Management of Subsolid Pulmonary Nodules Detected at CT: A Statement from the Fleischner Society as published in Radiology 2013; 266:304-317.  Intra and extrahepatic biliary duct dilatation, incompletely visualized.  Electronically Signed: By: Kennith Center M.D. On: 05/21/2013 09:39   US Abdomen Complete  05/21/2013   CLINICAL DATA:  Elevated liver function tests. Elevated bilirubin.  EXAM: ULTRASOUND ABDOMEN COMPLETE  COMPARISON:  CT scan dated  05/14/2013  FINDINGS: Gallbladder  There is marked to dilatation of the intrahepatic and extrahepatic bile ducts. The common bile duct is dilated into the distal pancreatic head. There is no appreciable mass or stone. The gallbladder is distended and contains sludge but no visible stones.  Common bile duct  Diameter: 16 mm in diameter.  Liver  Liver parenchyma is echogenic consistent with hepatic steatosis. Single 9 mm cyst in the anterior aspect of the left lobe.  IVC  No abnormality visualized.  Pancreas  Visualized portion unremarkable.  Spleen  Size and appearance within normal limits.  8.0 cm in length.  Right Kidney  Length: 11.8 cm. Echogenicity within normal limits. No mass or hydronephrosis visualized.  Left Kidney  Length: 12.5 cm. Echogenicity within normal limits. No mass or hydronephrosis visualized.  Abdominal aorta  Normal. 2.4 cm maximum diameter.  IMPRESSION: Marked dilatation of the biliary tree. The common bile duct is dilated into the distal pancreatic head. No appreciable mass or stone. Sludge in the gallbladder. Findings are consistent with distal common bile duct obstruction.  Hepatic steatosis.   Electronically Signed   By: Geanie Cooley M.D.   On: 05/21/2013 08:28   Ct Abdomen Pelvis W Contrast  05/24/2013   CLINICAL DATA:  Abdominal pain.  EXAM: CT ABDOMEN AND PELVIS WITH CONTRAST  TECHNIQUE: Multidetector CT imaging of the abdomen and pelvis was performed using the standard protocol following bolus administration of intravenous contrast.  CONTRAST:  50mL OMNIPAQUE IOHEXOL 300 MG/ML SOLN, OMNIPAQUE IOHEXOL 300 MG/ML SOLN  COMPARISON:  05/23/2013 a  FINDINGS: There is increasing intrahepatic and extrahepatic biliary ductal dilatation since yesterday's study. The endo biliary stent appears to have migrated distally somewhat. The tip remains in the mid to distal common bile duct then courses into the transverse duodenum. Pneumobilia. Mild distention of the gallbladder with gallbladder  wall thickening.  No well-defined pancreatic mass/lesion. No pancreatic ductal dilatation. Small scattered hypodensities in the liver are stable, likely small cysts. Spleen, pancreas, adrenals and kidneys are unremarkable. Small low-density area within the midpole of the right kidney anteriorly, likely small cyst. No hydronephrosis.  Sigmoid diverticulosis. No active diverticulitis. Small bowel is decompressed. No  free fluid, free air or adenopathy. Urinary bladder is unremarkable. Appendix is visualized and is normal.  Aorta and iliac vessels are calcified, non aneurysmal.  No acute bony abnormality.  IMPRESSION: Worsening intrahepatic and extrahepatic biliary ductal dilatation. Increasing distention of the gallbladder with mild gallbladder wall thickening. The biliary stent appears to migrated slightly distally but remains in the mid to distal common bile duct and extending into the transverse duodenum.  Sigmoid diverticulosis.   Electronically Signed   By: Charlett Nose M.D.   On: 05/24/2013 20:59   Dg Chest Port 1 View  05/24/2013   CLINICAL DATA:  Abdominal pain, shortness of breath  EXAM: PORTABLE CHEST - 1 VIEW  COMPARISON:  05/22/2009  FINDINGS: Heart is normal size. Patchy left perihilar and lower lobe atelectasis or infiltrate. Right lung is clear. No visible effusion. No acute bony abnormality.  IMPRESSION: Question early infiltrate versus atelectasis in the left perihilar and lower lobe regions.   Electronically Signed   By: Charlett Nose M.D.   On: 05/24/2013 18:42   Dg Ercp  05/26/2013   CLINICAL DATA:  Cholangitis, common duct stent placement  EXAM: ERCP  TECHNIQUE: Multiple spot images obtained with the fluoroscopic device and submitted for interpretation post-procedure.  COMPARISON:  Prior CT 05/24/2013 and 05/23/2013, abdominal ultrasound 05/21/2013  FINDINGS: On initial injection, there is transient ovoid filling defect at the mid portion of the dilated common duct, see for example image  labeled 4. This could be due to flow phenomenon although the presence of stones, sludge, or other material could also account for this appearance. There is no focal caliber abnormality of the common duct. A plastic stent was placed. Contrast is not seen within the duodenum. Mild intrahepatic ductal dilatation is also identified.  IMPRESSION: Early appearance of mid common duct filling defect which could represent inflow phenomenon although a stone, sludge, or other material could also account for this appearance.  Common and intrahepatic ductal dilatation reidentified, with placement of a plastic stent.  These images were submitted for radiologic interpretation only. Please see the procedural report for the amount of contrast and the fluoroscopy time utilized.   Electronically Signed   By: Christiana Pellant M.D.   On: 05/26/2013 17:55   Dg Ercp  05/21/2013   CLINICAL DATA:  Biliary obstruction.  EXAM: ERCP  TECHNIQUE: Multiple spot images obtained with the fluoroscopic device and submitted for interpretation post-procedure.  COMPARISON:  CT abdomen and pelvis 05/14/2013. Ultrasound from 05/21/2013.  FINDINGS: A stent has been placed across the area of biliary obstruction in the common bile duct. The biliary ductal dilatation is slightly decreased compared with the initial retrograde cholangiogram.  IMPRESSION: ERCP with stenting.  These images were submitted for radiologic interpretation only. Please see the procedural report for the amount of contrast and the fluoroscopy time utilized.   Electronically Signed   By: Davonna Belling M.D.   On: 05/21/2013 19:14    Microbiology: Recent Results (from the past 240 hour(s))  CULTURE, BLOOD (ROUTINE X 2)     Status: None   Collection Time    05/24/13  9:45 PM      Result Value Range Status   Specimen Description BLOOD LEFT ANTECUBITAL   Final   Special Requests BOTTLES DRAWN AEROBIC AND ANAEROBIC 5CC   Final   Culture  Setup Time     Final   Value: 05/25/2013  01:21     Performed at [facility]  Final   Value: ENTEROCOCCUS SPECIES     Note: COMBINATION THERAPY OF HIGH DOSE AMPICILLIN OR VANCOMYCIN, PLUS AN AMINOGLYCOSIDE, IS USUALLY INDICATED FOR SERIOUS ENTEROCOCCAL INFECTIONS.     VIRIDANS STREPTOCOCCUS     Note: Gram Stain Report Called to,Read Back By and Verified With: JENNY BURNS 05/25/13 @ 12:40PM BY RUSCOE A.     Performed at [facility]   Report Status 05/28/2013 FINAL   Final   Organism ID, Bacteria ENTEROCOCCUS SPECIES   Final  CULTURE, BLOOD (ROUTINE X 2)     Status: None   Collection Time    05/24/13 10:00 PM      Result Value Range Status   Specimen Description BLOOD LEFT HAND   Final   Special Requests BOTTLES DRAWN AEROBIC AND ANAEROBIC 5CC   Final   Culture  Setup Time     Final   Value: 05/25/2013 01:20     Performed at [facility]   Culture     Final   Value: ENTEROCOCCUS SPECIES     Note: SUSCEPTIBILITIES PERFORMED ON PREVIOUS CULTURE WITHIN THE LAST 5 DAYS.     VIRIDANS STREPTOCOCCUS     Note: Susceptibilities performed on previous culture within the last 5 days.     Note: Gram Stain Report Called to,Read Back By and Verified With: JENNY BURNS 05/25/13 @ 12:40PM BY RUSCOE A.   Report Status 05/28/2013 FINAL   Final  CULTURE, BLOOD (ROUTINE X 2)     Status: None   Collection Time    05/26/13  3:27 PM      Result Value Range Status   Specimen Description BLOOD LEFT HAND   Final   Special Requests BOTTLES DRAWN AEROBIC AND ANAEROBIC 5CC   Final   Culture  Setup Time     Final   Value: 05/26/2013 20:42     Performed at [facility]   Culture     Final   Value:        BLOOD CULTURE RECEIVED NO GROWTH TO DATE CULTURE WILL BE HELD FOR 5 DAYS BEFORE ISSUING A FINAL NEGATIVE REPORT     Performed at [facility]   Report Status PENDING   Incomplete  SURGICAL PCR SCREEN     Status: Abnormal   Collection Time    05/26/13  3:30 PM      Result Value Range Status    MRSA, PCR INVALID RESULTS, SPECIMEN SENT FOR CULTURE (*) NEGATIVE Final   Comment: RCRV     A THOMPSON RN 1902 05/26/13 A NAVARRO   Staphylococcus aureus INVALID RESULTS, SPECIMEN SENT FOR CULTURE (*) NEGATIVE Final   Comment:            The Xpert SA Assay (FDA     approved for NASAL specimens     in patients over 38 years of age),     is one component of     a comprehensive surveillance     program.  Test performance has     been validated by [facility] for patients greater     than or equal to 23 year old.     It is not intended     to diagnose infection nor to     guide or monitor treatment.  CULTURE, BLOOD (ROUTINE X 2)     Status: None   Collection Time    05/26/13  3:34 PM      Result Value Range Status  Specimen Description BLOOD RIGHT HAND   Final   Special Requests BOTTLES DRAWN AEROBIC ONLY 3CC   Final   Culture  Setup Time     Final   Value: 05/26/2013 20:39     Performed at [facility]   Culture     Final   Value: ENTEROCOCCUS SPECIES     Note: SUSCEPTIBILITIES PERFORMED ON PREVIOUS CULTURE WITHIN THE LAST 5 DAYS.     Note: Gram Stain Report Called to,Read Back By and Verified With: ANNIE H @ 1400 05/27/13 BY KRAWS     Performed at [facility]   Report Status 05/28/2013 FINAL   Final  MRSA CULTURE     Status: None   Collection Time    05/26/13  7:00 PM      Result Value Range Status   Specimen Description NOSE   Final   Special Requests NONE   Final   Culture     Final   Value: NO STAPHYLOCOCCUS AUREUS ISOLATED     Note: NOMRSA     Performed at [facility]   Report Status 05/29/2013 FINAL   Final  CULTURE, BLOOD (ROUTINE X 2)     Status: None   Collection Time    05/27/13  4:00 PM      Result Value Range Status   Specimen Description BLOOD LEFT HAND   Final   Special Requests BOTTLES DRAWN AEROBIC AND ANAEROBIC 10CC   Final   Culture  Setup Time     Final   Value: 05/27/2013 21:01     Performed at [facility]    Culture     Final   Value:        BLOOD CULTURE RECEIVED NO GROWTH TO DATE CULTURE WILL BE HELD FOR 5 DAYS BEFORE ISSUING A FINAL NEGATIVE REPORT     Performed at [facility]   Report Status PENDING   Incomplete  CULTURE, BLOOD (ROUTINE X 2)     Status: None   Collection Time    05/27/13  4:15 PM      Result Value Range Status   Specimen Description BLOOD LEFT ARM   Final   Special Requests BOTTLES DRAWN AEROBIC AND ANAEROBIC 10CC   Final   Culture  Setup Time     Final   Value: 05/27/2013 21:01     Performed at [facility]   Culture     Final   Value:        BLOOD CULTURE RECEIVED NO GROWTH TO DATE CULTURE WILL BE HELD FOR 5 DAYS BEFORE ISSUING A FINAL NEGATIVE REPORT     Performed at [facility]   Report Status PENDING   Incomplete     Labs: Basic Metabolic Panel:  Recent Labs Lab 05/26/13 0320 05/28/13 0240 05/29/13 0546 05/30/13 0521 05/31/13 0525  NA 132* 132* 134* 136 136  K 3.8 3.6 3.5 3.2* 3.8  CL 98 100 100 101 102  CO2 27 26 24 26 26   GLUCOSE 136* 95 98 114* 100*  BUN 17 11 10 11 9   CREATININE 0.80 0.83 0.82 0.82 0.90  CALCIUM 8.6 8.5 8.9 8.6 9.0   Liver Function Tests:  Recent Labs Lab 05/27/13 1434 05/28/13 0237 05/29/13 0546 05/30/13 0521 05/31/13 0525  AST 249* 245* 192* 145* 191*  ALT 413* 379* 331* 261* 258*  ALKPHOS 389* 401* 457* 458* 546*  BILITOT 5.5* 4.6* 3.7* 3.0* 3.2*  PROT 5.5* 5.4* 5.8* 5.6* 5.9*  ALBUMIN 2.2* 2.2* 2.4* 2.2* 2.3*    Recent Labs Lab 05/24/13 1935 05/26/13 0320  LIPASE 66* 13  AMYLASE  --  36   No results found for this basename: AMMONIA,  in the last 168 hours CBC:  Recent Labs Lab 05/24/13 1935 05/25/13 0510 05/28/13 0240 05/29/13 0546  WBC 10.0 15.0* 7.3 7.3  NEUTROABS 8.9* 13.6*  --   --   HGB 14.5 13.4 11.9* 12.4*  HCT 43.0 39.9 34.8* 36.4*  MCV 89.4 90.7 88.3 86.7  PLT 230 179 146* 189   Cardiac Enzymes: No results found for this basename: CKTOTAL, CKMB, CKMBINDEX,  TROPONINI,  in the last 168 hours BNP: BNP (last 3 results) No results found for this basename: PROBNP,  in the last 8760 hours CBG: No results found for this basename: GLUCAP,  in the last 168 hours     Signed:  Janalyn Higby  [facility] 05/31/2013, 2:48 PM

## 2013-05-31 NOTE — Progress Notes (Signed)
Notified [facility] of pt's scheduled dc home today with IV Abx. HH RN scheduled visit between 4-6 pm. Faxed Rx IV abx to [facility]. [facility] contact info added to dc instrucions. Isidoro Donning RN CCM Case Mgmt phone [phone]

## 2013-05-31 NOTE — Progress Notes (Signed)
[facility] PROGRESS NOTE  Terry Byrd AVW:[id number] DOB: June 09, 1956 DOA: 05/24/2013 PCP: [name], MD  Assessment/Plan: Enterococcus bacteremia  -currently on  Imipenem/gent per ID  -2D ECHO with no vegetations reported,  TEE negative for  Endocarditis -Secondary to acute cholangitis  -Repeat Blood cx from 10/21 negative so far -Place PICC this today -DC home on regimen of Imipenem/Gen for 2-4weeks per ID recs -needs Biweekly bmet and weekly Gent torugh -FU with Dr.Campbell on 11/6 -DC Home this afternoon if remains stable and ok with GI  Acute cholangitis  -improving -Secondary to biliary stent migration and obstruction of CBD -s/p ERCP/dilation and stent exchange 10/20  -LFTs/bili appreciate GI input -tolerating advanced diet   Abd pain  -improved  -started on Po Oxycontin yesterday, pain better controlled now, Norco PRN  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 Wall thickness was increased in a pattern of mild LVH. Systolic function was vigorous. The estimated ejection fraction was in the range of 65% to 70%. Although no diagnostic regional wall motion abnormality was identified, this possibility cannot be completely excluded on the basis of this study. Left ventricular diastolic function parameters were normal.     ERCP- with STENT PLACEMENT per GI  Antibiotics:  vanc and imipenem started 10/18  HPI/Subjective: Pain better, did not require IV dilaudid for 14hours, tolerating diet, Oxycontin started at 5pm yesterday   Objective: Filed Vitals:   05/31/13 0520  BP: 122/86  Pulse: 74  Temp: 98.5 F (36.9 C)  Resp: 16    Intake/Output Summary (Last 24 hours) at  05/31/13 0818 Last data filed at 05/31/13 0400  Gross per 24 hour  Intake   2535 ml  Output      0 ml  Net   2535 ml   Filed Weights   05/24/13 2346  Weight: 95.573 kg (210 lb 11.2 oz)   Exam:  General: alert & oriented x 3 In NAD, jaundiced Cardiovascular: RRR, nl S1 s2 Respiratory: CTAB Abdomen: soft +BS NT/ND, no masses palpable Extremities: No cyanosis and no edema    Data Reviewed: Basic Metabolic Panel:  Recent Labs Lab 05/26/13 0320 05/28/13 0240 05/29/13 0546 05/30/13 0521 05/31/13 0525  NA 132* 132* 134* 136 136  K 3.8 3.6 3.5 3.2* 3.8  CL 98 100 100 101 102  CO2 27 26 24 26 26   GLUCOSE 136* 95 98 114* 100*  BUN 17 11 10 11 9   CREATININE 0.80 0.83 0.82 0.82 0.90  CALCIUM 8.6 8.5 8.9 8.6 9.0   Liver Function Tests:  Recent Labs Lab 05/27/13 1434 05/28/13 0237 05/29/13 0546 05/30/13 0521 05/31/13 0525  AST 249* 245* 192* 145* 191*  ALT 413* 379* 331* 261* 258*  ALKPHOS 389* 401* 457* 458* 546*  BILITOT 5.5* 4.6* 3.7* 3.0* 3.2*  PROT 5.5* 5.4* 5.8* 5.6* 5.9*  ALBUMIN 2.2* 2.2* 2.4* 2.2* 2.3*    Recent Labs Lab 05/24/13 1935 05/26/13 0320  LIPASE 66* 13  AMYLASE  --  36   No results found for this basename: AMMONIA,  in the last 168 hours CBC:  Recent Labs Lab 05/24/13 1935 05/25/13 0510 05/28/13 0240 05/29/13 0546  WBC 10.0 15.0* 7.3 7.3  NEUTROABS 8.9* 13.6*  --   --  HGB 14.5 13.4 11.9* 12.4*  HCT 43.0 39.9 34.8* 36.4*  MCV 89.4 90.7 88.3 86.7  PLT 230 179 146* 189   Cardiac Enzymes: No results found for this basename: CKTOTAL, CKMB, CKMBINDEX, TROPONINI,  in the last 168 hours BNP (last 3 results) No results found for this basename: PROBNP,  in the last 8760 hours CBG: No results found for this basename: GLUCAP,  in the last 168 hours  Recent Results (from the past 240 hour(s))  CULTURE, BLOOD (ROUTINE X 2)     Status: None   Collection Time    05/24/13  9:45 PM      Result Value Range Status   Specimen Description  BLOOD LEFT ANTECUBITAL   Final   Special Requests BOTTLES DRAWN AEROBIC AND ANAEROBIC 5CC   Final   Culture  Setup Time     Final   Value: 05/25/2013 01:21     Performed at [facility]   Culture     Final   Value: ENTEROCOCCUS SPECIES     Note: COMBINATION THERAPY OF HIGH DOSE AMPICILLIN OR VANCOMYCIN, PLUS AN AMINOGLYCOSIDE, IS USUALLY INDICATED FOR SERIOUS ENTEROCOCCAL INFECTIONS.     VIRIDANS STREPTOCOCCUS     Note: Gram Stain Report Called to,Read Back By and Verified With: JENNY BURNS 05/25/13 @ 12:40PM BY RUSCOE A.     Performed at [facility]   Report Status 05/28/2013 FINAL   Final   Organism ID, Bacteria ENTEROCOCCUS SPECIES   Final  CULTURE, BLOOD (ROUTINE X 2)     Status: None   Collection Time    05/24/13 10:00 PM      Result Value Range Status   Specimen Description BLOOD LEFT HAND   Final   Special Requests BOTTLES DRAWN AEROBIC AND ANAEROBIC 5CC   Final   Culture  Setup Time     Final   Value: 05/25/2013 01:20     Performed at [facility]   Culture     Final   Value: ENTEROCOCCUS SPECIES     Note: SUSCEPTIBILITIES PERFORMED ON PREVIOUS CULTURE WITHIN THE LAST 5 DAYS.     VIRIDANS STREPTOCOCCUS     Note: Susceptibilities performed on previous culture within the last 5 days.     Note: Gram Stain Report Called to,Read Back By and Verified With: JENNY BURNS 05/25/13 @ 12:40PM BY RUSCOE A.   Report Status 05/28/2013 FINAL   Final  CULTURE, BLOOD (ROUTINE X 2)     Status: None   Collection Time    05/26/13  3:27 PM      Result Value Range Status   Specimen Description BLOOD LEFT HAND   Final   Special Requests BOTTLES DRAWN AEROBIC AND ANAEROBIC 5CC   Final   Culture  Setup Time     Final   Value: 05/26/2013 20:42     Performed at [facility]   Culture     Final   Value:        BLOOD CULTURE RECEIVED NO GROWTH TO DATE CULTURE WILL BE HELD FOR 5 DAYS BEFORE ISSUING A FINAL NEGATIVE REPORT     Performed at [facility]    Report Status PENDING   Incomplete  SURGICAL PCR SCREEN     Status: Abnormal   Collection Time    05/26/13  3:30 PM      Result Value Range Status   MRSA, PCR INVALID RESULTS, SPECIMEN SENT FOR CULTURE (*) NEGATIVE Final   Comment: RCRV  A THOMPSON RN 1902 05/26/13 A NAVARRO   Staphylococcus aureus INVALID RESULTS, SPECIMEN SENT FOR CULTURE (*) NEGATIVE Final   Comment:            The Xpert SA Assay (FDA     approved for NASAL specimens     in patients over 27 years of age),     is one component of     a comprehensive surveillance     program.  Test performance has     been validated by [employer] for patients greater     than or equal to 69 year old.     It is not intended     to diagnose infection nor to     guide or monitor treatment.  CULTURE, BLOOD (ROUTINE X 2)     Status: None   Collection Time    05/26/13  3:34 PM      Result Value Range Status   Specimen Description BLOOD RIGHT HAND   Final   Special Requests BOTTLES DRAWN AEROBIC ONLY 3CC   Final   Culture  Setup Time     Final   Value: 05/26/2013 20:39     Performed at [facility]   Culture     Final   Value: ENTEROCOCCUS SPECIES     Note: SUSCEPTIBILITIES PERFORMED ON PREVIOUS CULTURE WITHIN THE LAST 5 DAYS.     Note: Gram Stain Report Called to,Read Back By and Verified With: ANNIE H @ 1400 05/27/13 BY KRAWS     Performed at [facility]   Report Status 05/28/2013 FINAL   Final  MRSA CULTURE     Status: None   Collection Time    05/26/13  7:00 PM      Result Value Range Status   Specimen Description NOSE   Final   Special Requests NONE   Final   Culture     Final   Value: NO STAPHYLOCOCCUS AUREUS ISOLATED     Note: NOMRSA     Performed at [facility]   Report Status 05/29/2013 FINAL   Final  CULTURE, BLOOD (ROUTINE X 2)     Status: None   Collection Time    05/27/13  4:00 PM      Result Value Range Status   Specimen Description BLOOD LEFT HAND   Final   Special  Requests BOTTLES DRAWN AEROBIC AND ANAEROBIC 10CC   Final   Culture  Setup Time     Final   Value: 05/27/2013 21:01     Performed at [facility]   Culture     Final   Value:        BLOOD CULTURE RECEIVED NO GROWTH TO DATE CULTURE WILL BE HELD FOR 5 DAYS BEFORE ISSUING A FINAL NEGATIVE REPORT     Performed at [facility]   Report Status PENDING   Incomplete  CULTURE, BLOOD (ROUTINE X 2)     Status: None   Collection Time    05/27/13  4:15 PM      Result Value Range Status   Specimen Description BLOOD LEFT ARM   Final   Special Requests BOTTLES DRAWN AEROBIC AND ANAEROBIC 10CC   Final   Culture  Setup Time     Final   Value: 05/27/2013 21:01     Performed at [facility]   Culture     Final   Value:        BLOOD  CULTURE RECEIVED NO GROWTH TO DATE CULTURE WILL BE HELD FOR 5 DAYS BEFORE ISSUING A FINAL NEGATIVE REPORT     Performed at [facility]   Report Status PENDING   Incomplete     Studies: No results found.  Scheduled Meds: . diphenhydrAMINE  12.5 mg Intravenous Once  . enoxaparin (LOVENOX) injection  40 mg Subcutaneous Q24H  . feeding supplement (ENSURE COMPLETE)  237 mL Oral Q24H  . gentamicin  80 mg Intravenous Q8H  . imipenem-cilastatin  500 mg Intravenous Q6H  . OxyCODONE  15 mg Oral Q12H  . pantoprazole  40 mg Oral Daily  . sodium chloride  3 mL Intravenous Q12H   Continuous Infusions: . sodium chloride 75 mL/hr at 05/29/13 1925  . sodium chloride 20 mL/hr (05/27/13 1730)    Active Problems:   Pancreatic mass   Obstruction of bile duct   Sepsis   Acute cholangitis   Transaminasemia    Time spent:    [facility]  [facility] Pager [phone]. If 7PM-7AM, please contact night-coverage at [email or web address], password [facility] 05/31/2013, 8:18 AM  LOS: 7 days

## 2013-06-01 LAB — CULTURE, BLOOD (ROUTINE X 2): Culture: NO GROWTH

## 2013-06-02 ENCOUNTER — Encounter: Payer: Self-pay | Admitting: Internal Medicine

## 2013-06-02 LAB — CULTURE, BLOOD (ROUTINE X 2)

## 2013-06-09 ENCOUNTER — Telehealth: Payer: Self-pay | Admitting: Gastroenterology

## 2013-06-09 NOTE — Telephone Encounter (Signed)
Reviewed EUS information at [facility]

## 2013-06-12 ENCOUNTER — Encounter: Payer: Self-pay | Admitting: *Deleted

## 2013-06-12 ENCOUNTER — Inpatient Hospital Stay: Payer: Managed Care, Other (non HMO) | Admitting: Internal Medicine

## 2013-06-12 NOTE — Telephone Encounter (Signed)
Spoke with pt's wife.  Pt is currently hospitalized at [facility] and under care there.  Wife's cell phone number is [phone] if Dr. Orvan Falconer has any questions.

## 2013-06-18 ENCOUNTER — Telehealth: Payer: Self-pay | Admitting: *Deleted

## 2013-06-18 NOTE — Telephone Encounter (Signed)
Was admitted to [facility] and is still currently hospitalized.  Pt does not know when he will be discharged.

## 2013-06-20 ENCOUNTER — Institutional Professional Consult (permissible substitution): Payer: Managed Care, Other (non HMO) | Admitting: Internal Medicine

## 2013-06-25 ENCOUNTER — Telehealth: Payer: Self-pay | Admitting: *Deleted

## 2013-06-25 NOTE — Telephone Encounter (Signed)
Requested pt call [facility] to make [facility] appt.

## 2013-07-08 ENCOUNTER — Telehealth: Payer: Self-pay

## 2013-07-08 ENCOUNTER — Other Ambulatory Visit: Payer: Self-pay

## 2013-07-08 ENCOUNTER — Other Ambulatory Visit (INDEPENDENT_AMBULATORY_CARE_PROVIDER_SITE_OTHER): Payer: Managed Care, Other (non HMO)

## 2013-07-08 DIAGNOSIS — K831 Obstruction of bile duct: Secondary | ICD-10-CM

## 2013-07-08 LAB — CBC WITH DIFFERENTIAL/PLATELET
Basophils Absolute: 0 10*3/uL (ref 0.0–0.1)
Basophils Relative: 0.2 % (ref 0.0–3.0)
Eosinophils Absolute: 0.1 10*3/uL (ref 0.0–0.7)
HCT: 36.7 % — ABNORMAL LOW (ref 39.0–52.0)
Hemoglobin: 12.5 g/dL — ABNORMAL LOW (ref 13.0–17.0)
Lymphocytes Relative: 18.5 % (ref 12.0–46.0)
MCHC: 34 g/dL (ref 30.0–36.0)
Monocytes Absolute: 1.1 10*3/uL — ABNORMAL HIGH (ref 0.1–1.0)
Neutro Abs: 7.9 10*3/uL — ABNORMAL HIGH (ref 1.4–7.7)
Neutrophils Relative %: 70.4 % (ref 43.0–77.0)
Platelets: 425 10*3/uL — ABNORMAL HIGH (ref 150.0–400.0)
RDW: 14.9 % — ABNORMAL HIGH (ref 11.5–14.6)

## 2013-07-08 LAB — COMPREHENSIVE METABOLIC PANEL
ALT: 43 U/L (ref 0–53)
AST: 25 U/L (ref 0–37)
Alkaline Phosphatase: 148 U/L — ABNORMAL HIGH (ref 39–117)
BUN: 12 mg/dL (ref 6–23)
Calcium: 9 mg/dL (ref 8.4–10.5)
Chloride: 97 mEq/L (ref 96–112)
Creatinine, Ser: 0.8 mg/dL (ref 0.4–1.5)
Sodium: 134 mEq/L — ABNORMAL LOW (ref 135–145)
Total Bilirubin: 0.8 mg/dL (ref 0.3–1.2)

## 2013-07-08 NOTE — Telephone Encounter (Signed)
Left message for pt to call back. Pt scheduled for CT of A/P at [facility] CT tomorrow at 1pm. Pt to be NPO after 9am, drink bottle 1 of contrast at 11am, second bottle at 12noon. Pt needs to come today for labs.

## 2013-07-08 NOTE — Telephone Encounter (Signed)
Message copied by Chrystie Nose on Tue Jul 08, 2013  1:42 PM ------      Message from: Hilarie Fredrickson      Created: Tue Jul 08, 2013 12:56 PM      Regarding: Needs labs and CT       Bonita Quin, spoke the patient's wife Inocencio Homes [phone]). Patient was seen at [facility] yesterday by Dr. Jacquenette Shone. Back in [place] today. Having problems with left lower quadrant and lower back pain. [facility] recommended CT. Please have the patient come in for CBC, comprehensive metabolic panel, and lipase. Also, set up contrast-enhanced CT scan of the abdomen and pelvis "lower abdominal and back pain, history of biliary obstruction" either today or tomorrow. You should reach [place] at that number to arrange. Thanks ------

## 2013-07-08 NOTE — Telephone Encounter (Signed)
Spoke with pt and he is aware. Contrast left up front for pickup. Wife aware also.

## 2013-07-09 ENCOUNTER — Ambulatory Visit (INDEPENDENT_AMBULATORY_CARE_PROVIDER_SITE_OTHER)
Admission: RE | Admit: 2013-07-09 | Discharge: 2013-07-09 | Disposition: A | Discharge status: Home / Self Care | Payer: Managed Care, Other (non HMO) | Source: Ambulatory Visit | Attending: Internal Medicine | Admitting: Internal Medicine

## 2013-07-09 DIAGNOSIS — K831 Obstruction of bile duct: Secondary | ICD-10-CM

## 2013-07-09 MED ORDER — IOHEXOL 300 MG/ML  SOLN
100.0000 mL | Freq: Once | INTRAMUSCULAR | Status: AC | PRN
  Administered 2013-07-09: 100 mL via INTRAVENOUS

## 2013-07-17 ENCOUNTER — Other Ambulatory Visit: Payer: Self-pay

## 2013-07-17 ENCOUNTER — Telehealth: Payer: Self-pay

## 2013-07-17 ENCOUNTER — Other Ambulatory Visit (INDEPENDENT_AMBULATORY_CARE_PROVIDER_SITE_OTHER): Payer: Managed Care, Other (non HMO)

## 2013-07-17 DIAGNOSIS — R112 Nausea with vomiting, unspecified: Secondary | ICD-10-CM

## 2013-07-17 LAB — COMPREHENSIVE METABOLIC PANEL
ALT: 32 U/L (ref 0–53)
AST: 28 U/L (ref 0–37)
Albumin: 2.9 g/dL — ABNORMAL LOW (ref 3.5–5.2)
Alkaline Phosphatase: 121 U/L — ABNORMAL HIGH (ref 39–117)
BUN: 10 mg/dL (ref 6–23)
CO2: 29 mEq/L (ref 19–32)
Calcium: 9 mg/dL (ref 8.4–10.5)
Creatinine, Ser: 0.9 mg/dL (ref 0.4–1.5)
GFR: 92.31 mL/min (ref >60.00)
Sodium: 137 mEq/L (ref 135–145)
Total Protein: 7 g/dL (ref 6.0–8.3)

## 2013-07-17 LAB — CBC WITH DIFFERENTIAL/PLATELET
Basophils Relative: 0.2 % (ref 0.0–3.0)
Eosinophils Absolute: 0.1 10*3/uL (ref 0.0–0.7)
Eosinophils Relative: 0.7 % (ref 0.0–5.0)
Hemoglobin: 13.1 g/dL (ref 13.0–17.0)
MCHC: 33.3 g/dL (ref 30.0–36.0)
MCV: 85.9 fl (ref 78.0–100.0)
Monocytes Absolute: 0.4 10*3/uL (ref 0.1–1.0)
Monocytes Relative: 3.7 % (ref 3.0–12.0)
Neutro Abs: 8.1 10*3/uL — ABNORMAL HIGH (ref 1.4–7.7)
RBC: 4.57 Mil/uL (ref 4.22–5.81)

## 2013-07-17 NOTE — Telephone Encounter (Signed)
Pts wife states that the pt has not been eating much at all or drinking many fluids. States Dr. Jacquenette Shone at [facility] wanted him to have some labs done today to see if he is dehydrated. States the pt started vomiting this morning but there was only a little yellowish color fluid coming up. What labs would you like for him to have? Please advise.

## 2013-07-17 NOTE — Telephone Encounter (Signed)
Have him come in now for CBC, CMET stat. Thanks

## 2013-07-17 NOTE — Telephone Encounter (Signed)
Spoke with pts wife and she is going to bring him in for labs.

## 2013-08-12 ENCOUNTER — Encounter: Payer: Self-pay | Admitting: Internal Medicine

## 2013-08-27 ENCOUNTER — Telehealth: Payer: Self-pay | Admitting: Family Medicine

## 2013-08-27 NOTE — Telephone Encounter (Signed)
Pt's wife called and wanted to know if Dr. Clent RidgesFry could manage IV fluid for pt, if not can we refer pt to someone who can do this?

## 2013-08-27 NOTE — Telephone Encounter (Signed)
I spoke to his wife, Dondra SpryGail, and she said he started chemotherapy today for the pancreatic cancer. He is under the care of [facility]. He is back home now, and he has home health nurses working with him. His wife is under the impression that the [facility] doctors asked if there was someone here in [place] that could manage his fluid status. He has poor oral intake and he may need IV fluids at times. I explained to her that we cannot do this because we have not seen him for months, and that managing the fluid status of a critical patient is very difficult. It requires frequent visits to the doctor, close monitoring by home nurses, and frequent lab testing. Since we are not part of the [facility] system we have no way of accessing his labs results, etc very efficiently, and trying to do all this over the telephone sight unseen would be difficult and dangerous. I suggested that she have the [facility] oncologists work with the home nurses to manage his fluid status together. Also it sounds like he would be a candidate for Hospice services, and this program could be very helpful to the family and to the oncologists. She said she would relay this back to his doctors.

## 2014-05-07 DEATH — deceased

## 2014-05-22 ENCOUNTER — Other Ambulatory Visit: Payer: Self-pay

## 2014-10-16 IMAGING — CT CT ABD-PELV W/ CM
1 of 3 series · 14 of 32 positions shown, 19 images · IV contrast (OMNIPAQUE 300)
Comparison: 05/23/2013 a

CLINICAL DATA: Abdominal pain.

EXAM:
CT ABDOMEN AND PELVIS WITH CONTRAST
TECHNIQUE: Multidetector CT imaging of the abdomen and pelvis was performed
using the standard protocol following bolus administration of
intravenous contrast.
CONTRAST:  50mL OMNIPAQUE IOHEXOL 300 MG/ML SOLN, 100mL OMNIPAQUE
IOHEXOL 300 MG/ML SOLN

[Series 2: abd/pel with · axial · 0.74mm/px · z∈[-544,-119]mm · 14 of 95 slices shown, 19 images]
[im 5/95  soft-tissue]
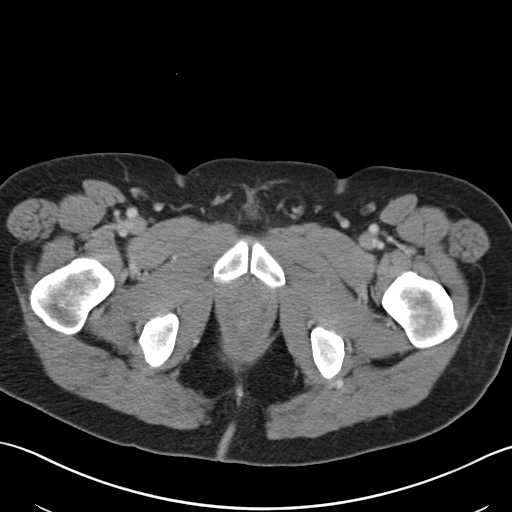
[im 5/95  bone]
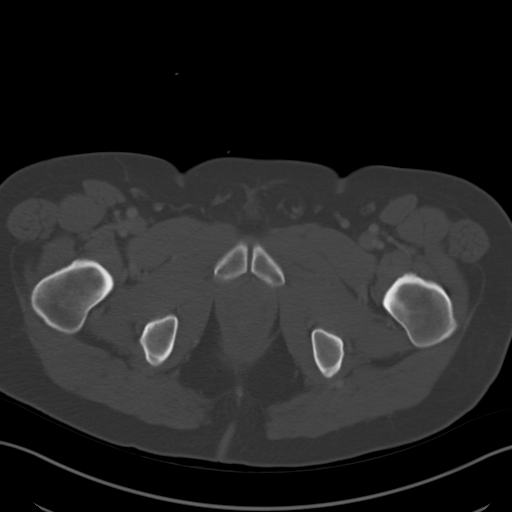
[im 15/95  soft-tissue]
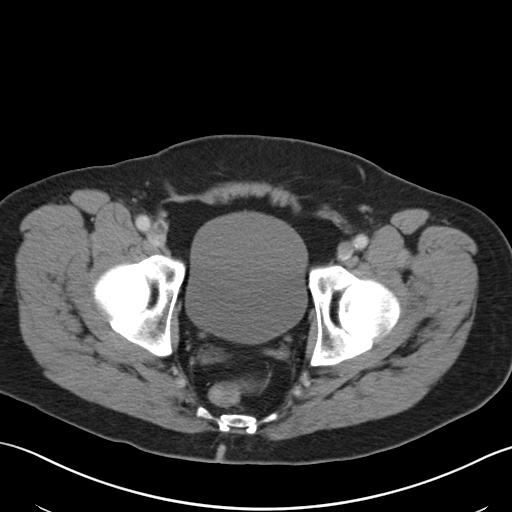
[im 19/95  soft-tissue]
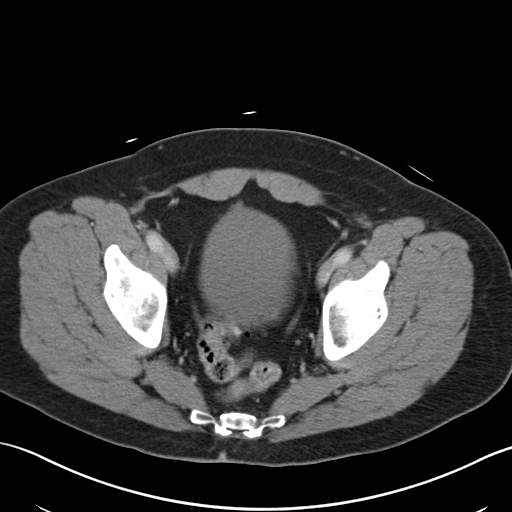
[im 29/95  soft-tissue]
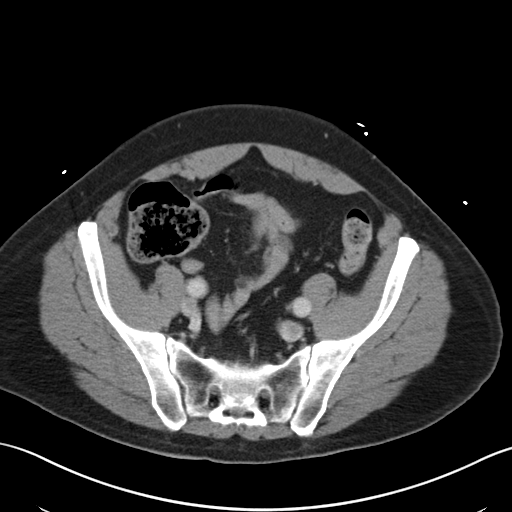
[im 33/95  soft-tissue]
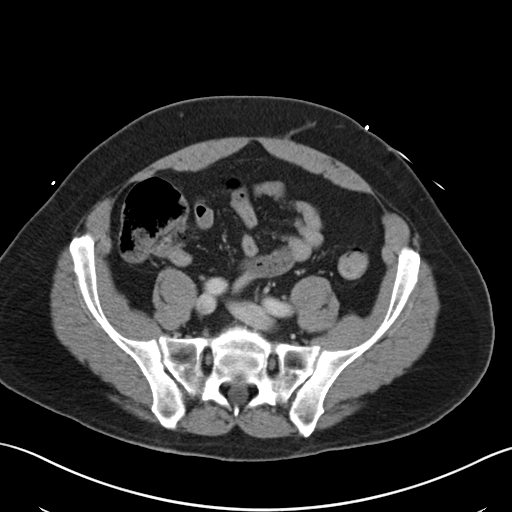
[im 43/95  soft-tissue]
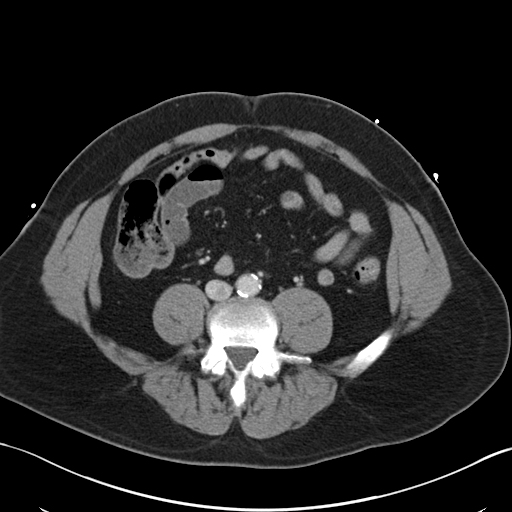
[im 48/95  soft-tissue]
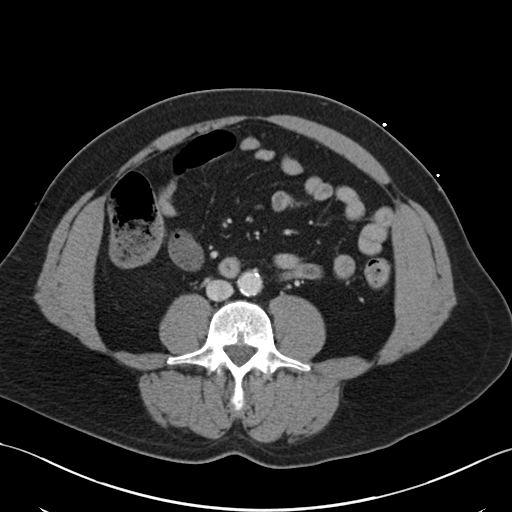
[im 52/95  soft-tissue]
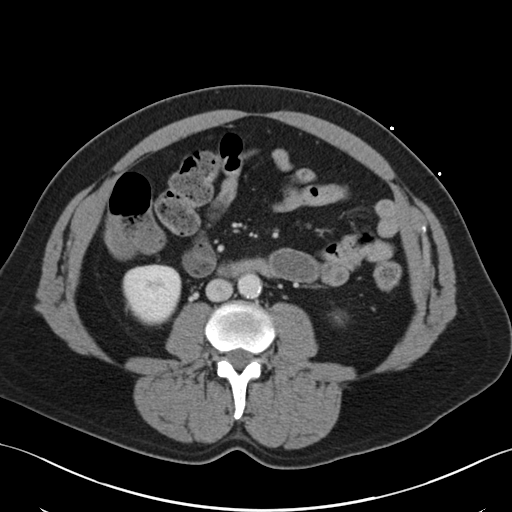
[im 62/95  soft-tissue]
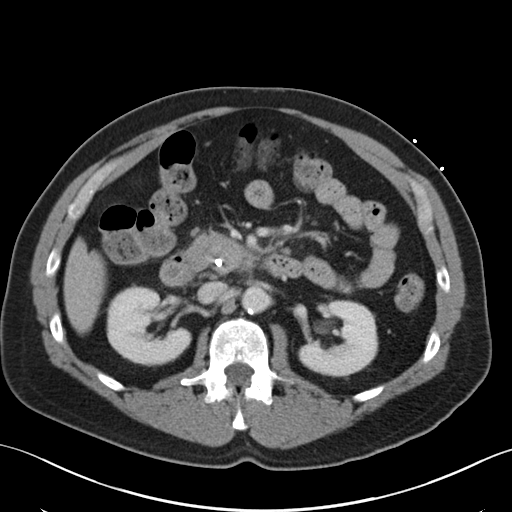
[im 62/95  bone]
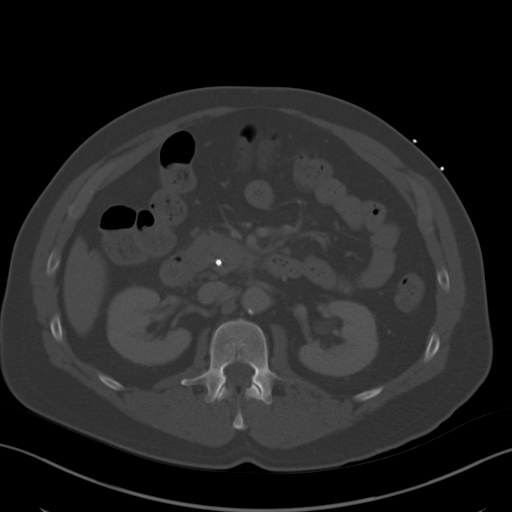
[im 66/95  soft-tissue]
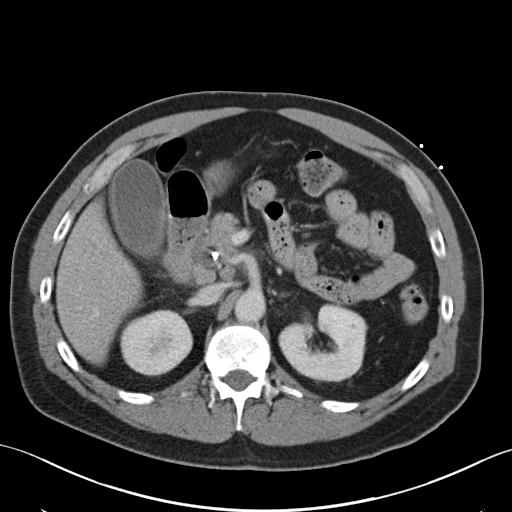
[im 76/95  soft-tissue]
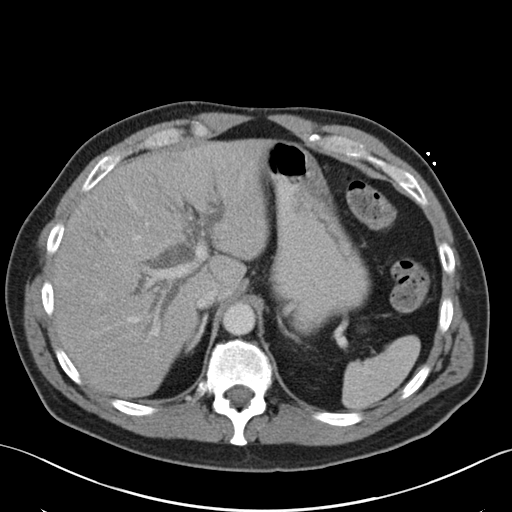
[im 76/95  lung]
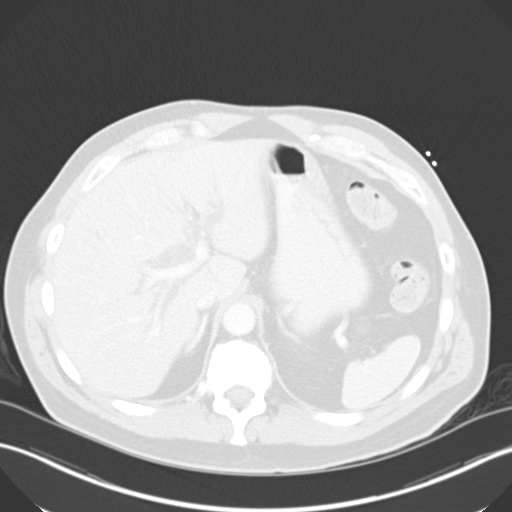
[im 80/95  soft-tissue]
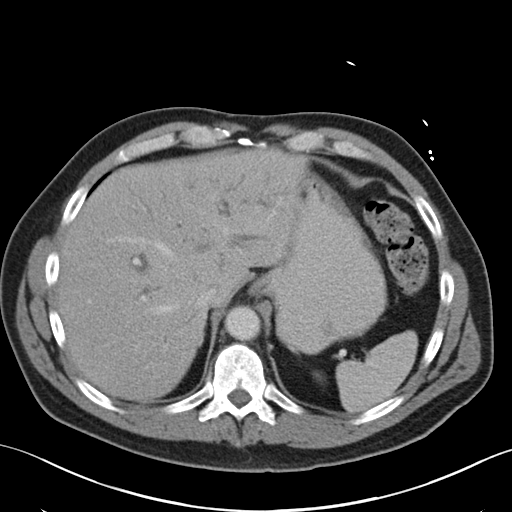
[im 80/95  lung]
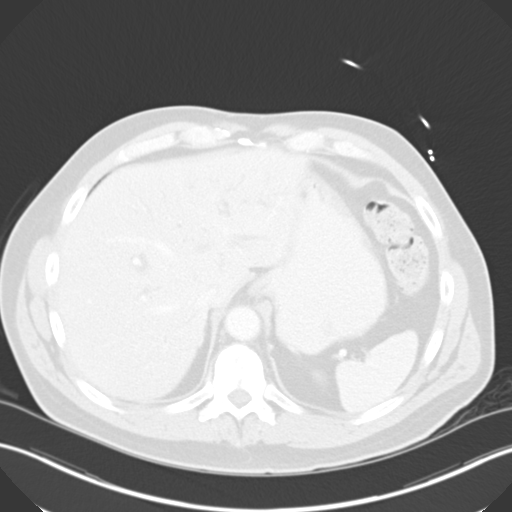
[im 85/95  lung]
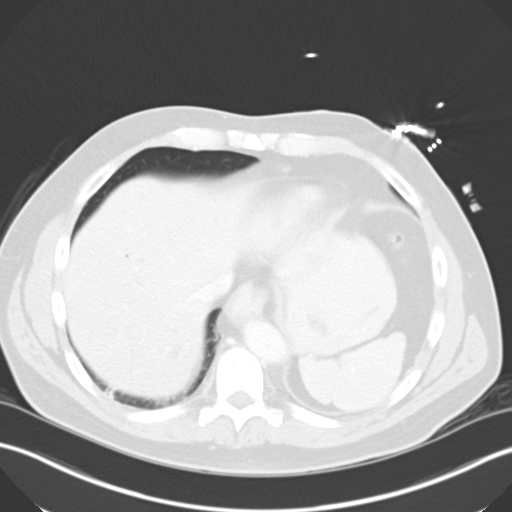
[im 90/95  soft-tissue]
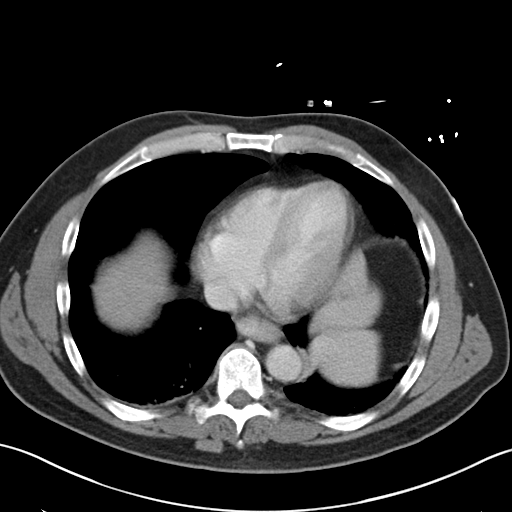
[im 90/95  lung]
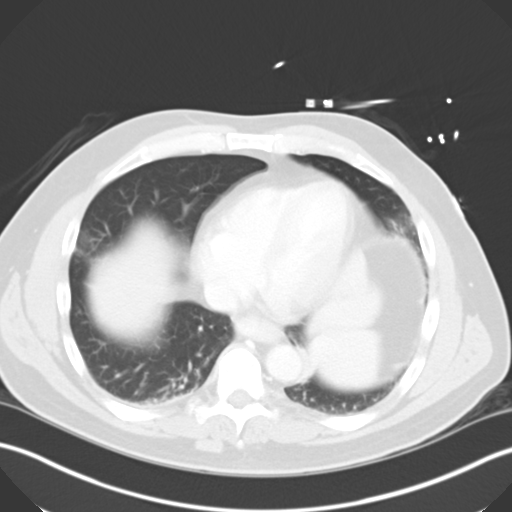

[14 of 32 positions shown; findings below may reference images not displayed]

FINDINGS: There is increasing intrahepatic and extrahepatic biliary ductal
dilatation since yesterday's study. The endo biliary stent appears
to have migrated distally somewhat. The tip remains in the mid to
distal common bile duct then courses into the transverse duodenum.
Pneumobilia. Mild distention of the gallbladder with gallbladder
wall thickening.

No well-defined pancreatic mass/lesion. No pancreatic ductal
dilatation. Small scattered hypodensities in the liver are stable,
likely small cysts. Spleen, pancreas, adrenals and kidneys are
unremarkable. Small low-density area within the midpole of the right
kidney anteriorly, likely small cyst. No hydronephrosis.

Sigmoid diverticulosis. No active diverticulitis. Small bowel is
decompressed. No free fluid, free air or adenopathy. Urinary bladder
is unremarkable. Appendix is visualized and is normal.

Aorta and iliac vessels are calcified, non aneurysmal.

No acute bony abnormality.
IMPRESSION: Worsening intrahepatic and extrahepatic biliary ductal dilatation.
Increasing distention of the gallbladder with mild gallbladder wall
thickening. The biliary stent appears to migrated slightly distally
but remains in the mid to distal common bile duct and extending into
the transverse duodenum.

Sigmoid diverticulosis.

## 2014-10-17 IMAGING — CR DG CHEST 2V
2 series · 2 of 2 positions shown · non-contrast
Comparison: 05/24/2013

CLINICAL DATA: Increase in chest pain and shortness of Breath.

EXAM:
CHEST  2 VIEW

[w chest lat]
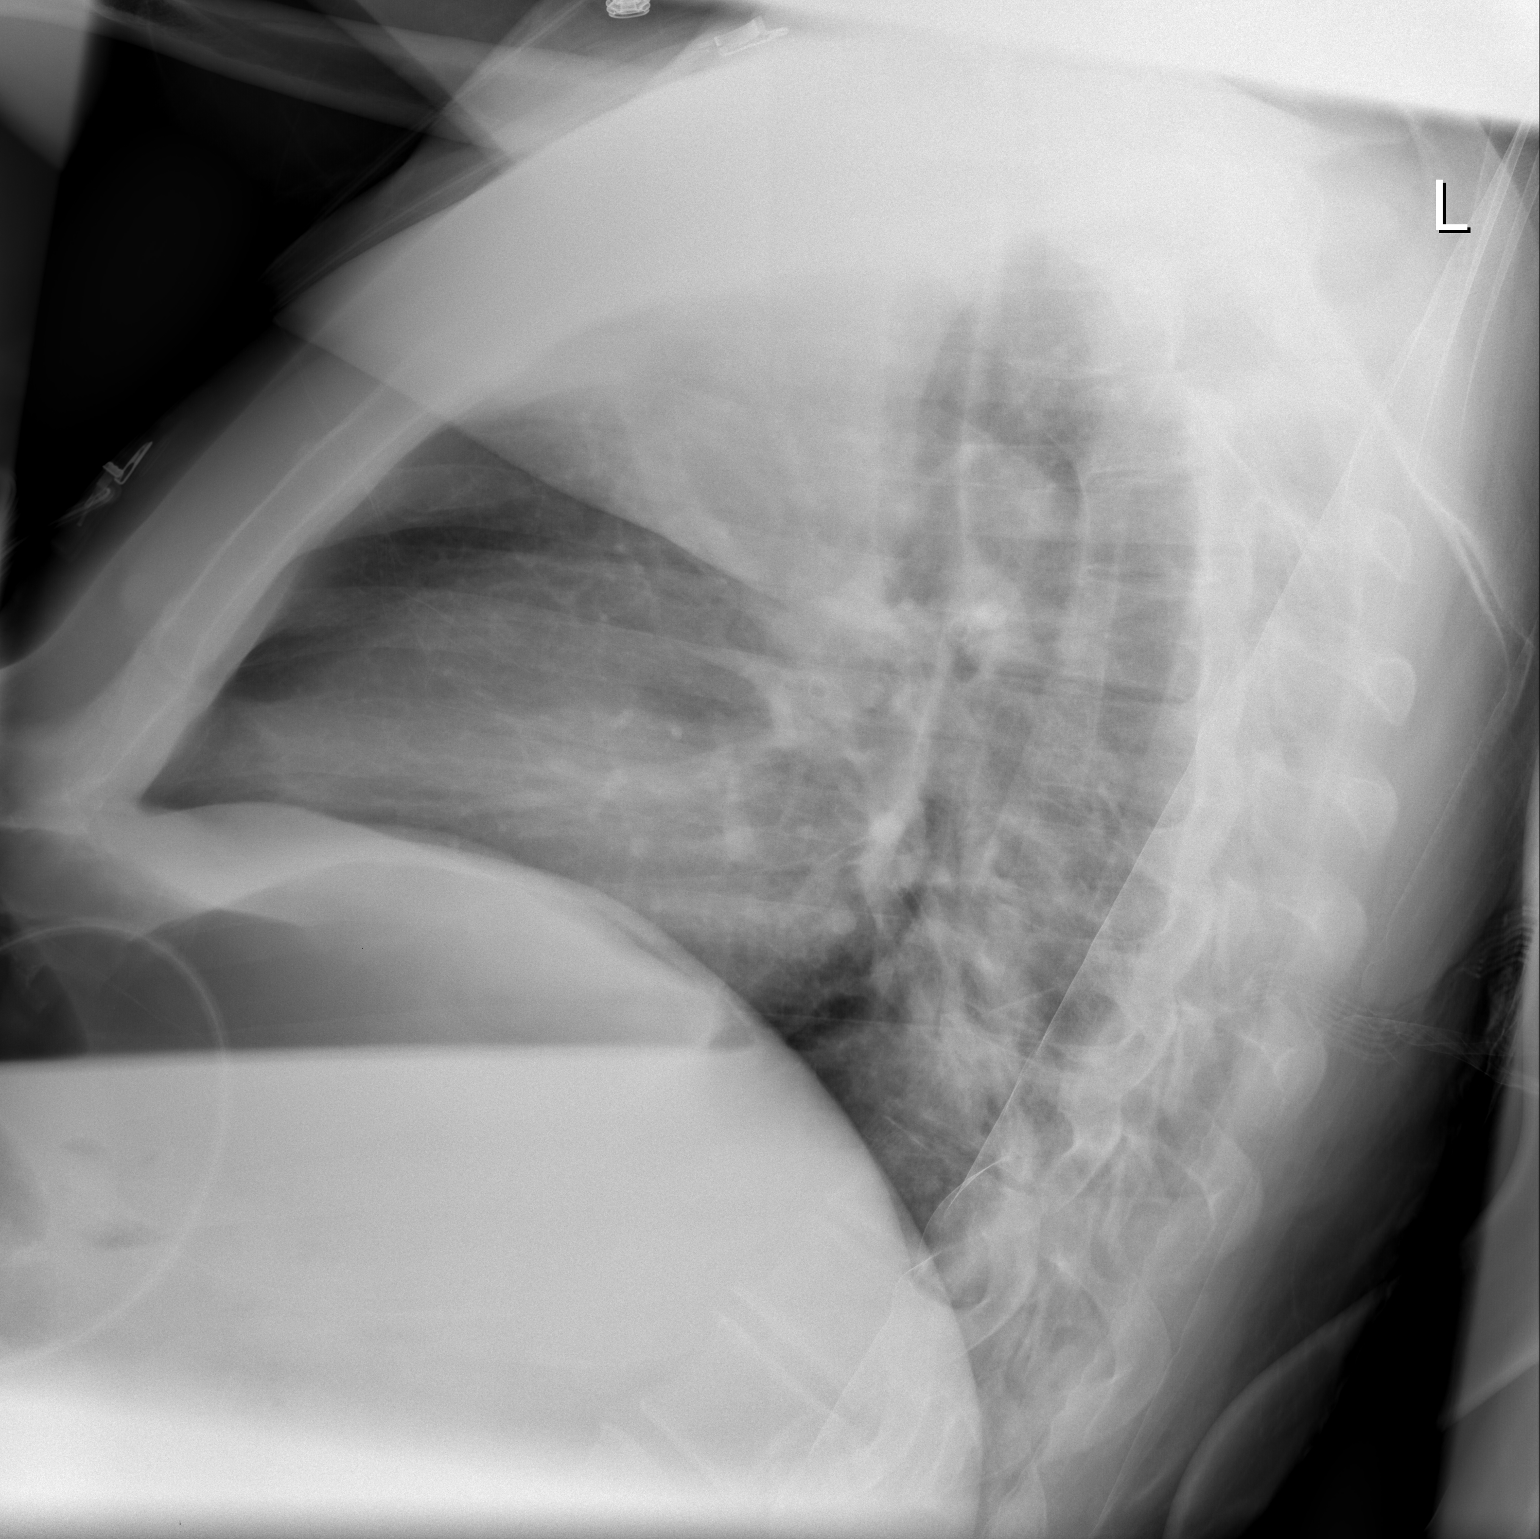

[view not recorded]
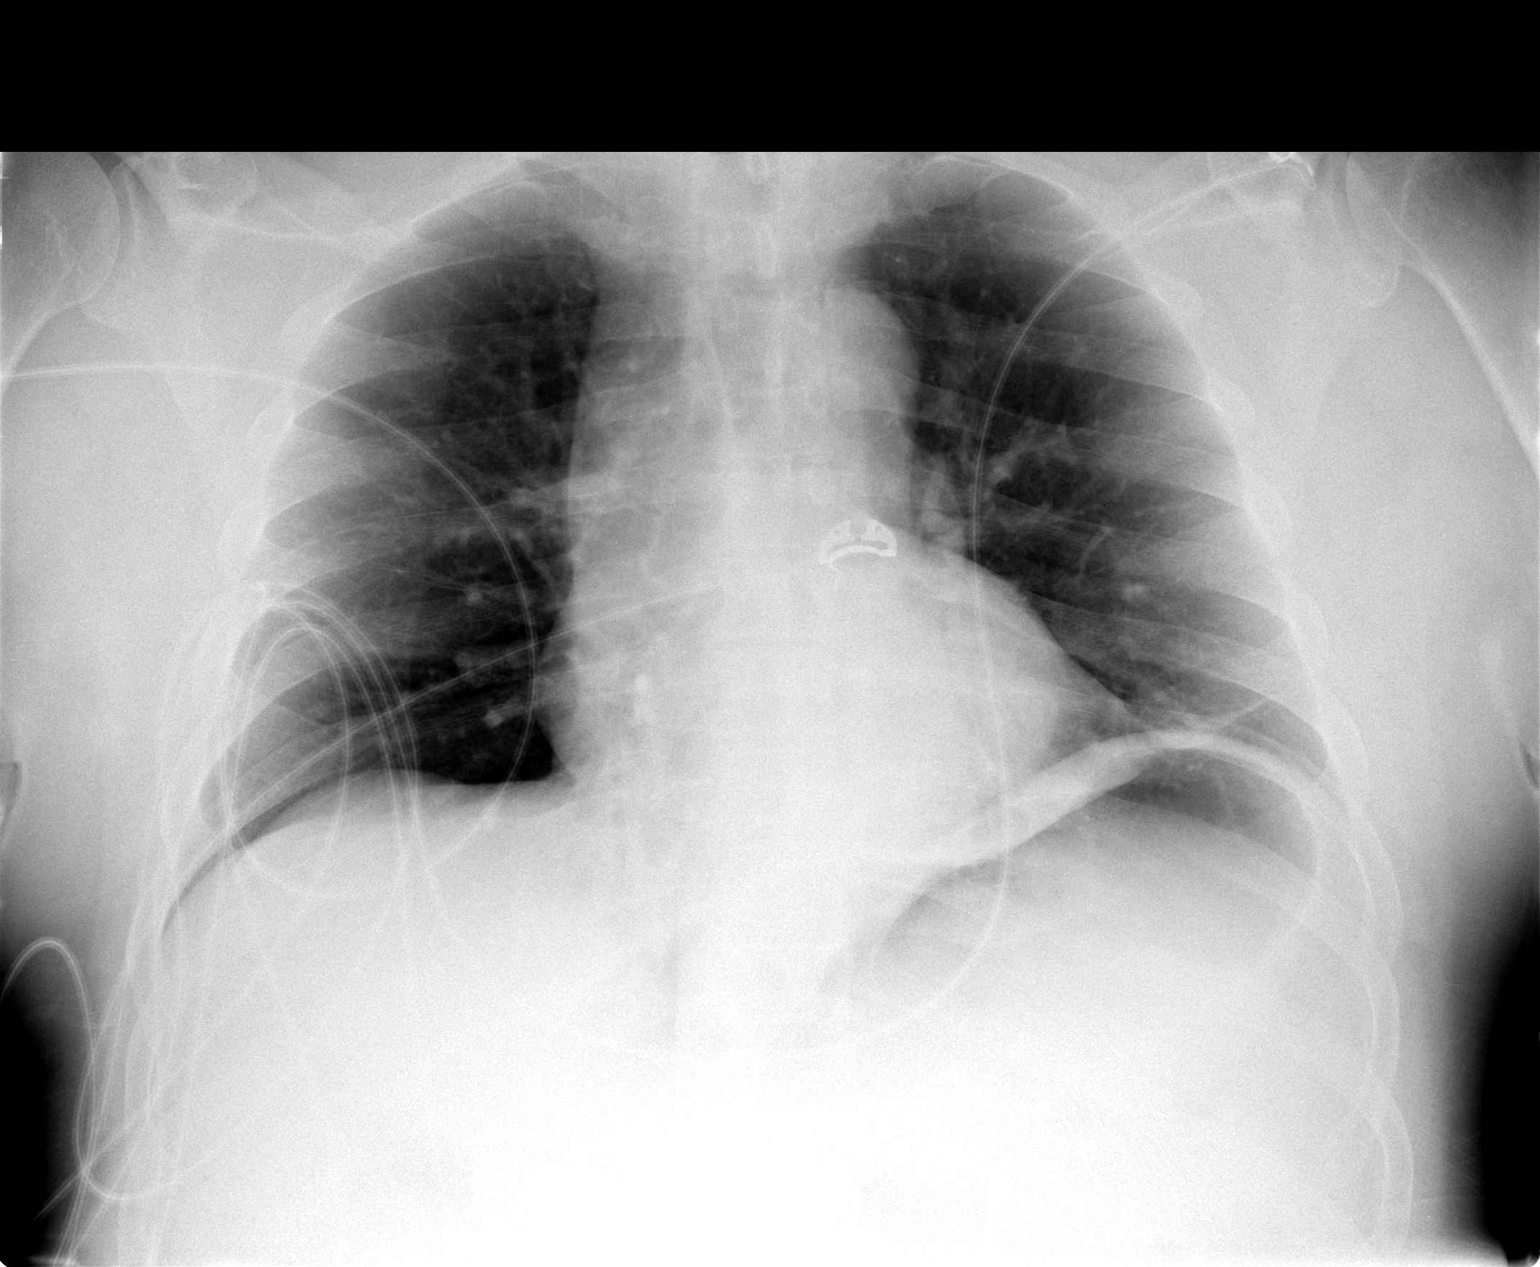

[2 of 2 positions shown; findings below may reference images not displayed]

FINDINGS: On the lateral view, there is relative increased opacity posteriorly
near the lung base. This may reflect a small left medial lower lobe
infiltrate. It may be atelectasis accentuated by respiratory motion.

The lungs are otherwise clear. No pleural effusion or pneumothorax.

The heart, mediastinum and hila are unremarkable.
IMPRESSION: 1. Left medial lung base opacity. This could reflect infiltrate or
atelectasis. It is best visualized on the lateral view.
2. No other abnormalities.

## 2014-10-18 IMAGING — RF DG ERCP WO/W SPHINCTEROTOMY
1 series · 14 of 21 positions shown · non-contrast
Comparison: Prior CT 05/24/2013 and 05/23/2013, abdominal
ultrasound 05/21/2013

CLINICAL DATA: Cholangitis, common duct stent placement

EXAM:
ERCP
TECHNIQUE: Multiple spot images obtained with the fluoroscopic device and
submitted for interpretation post-procedure.

[Series 1: run · 14 of 21 slices shown]
[im 1/21]
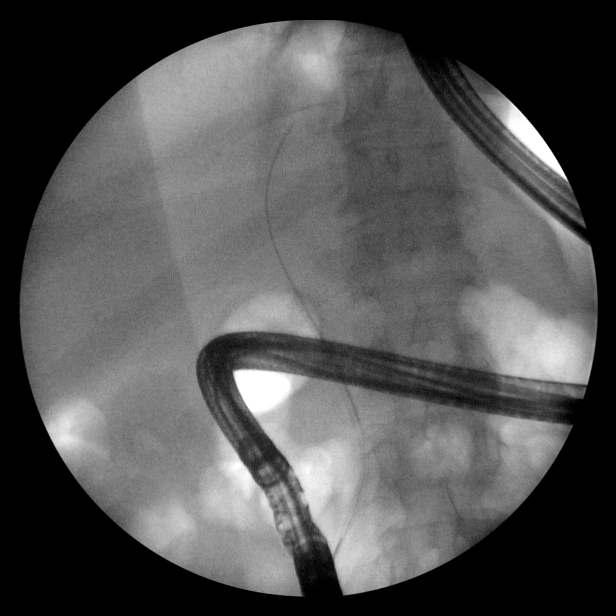
[im 3/21]
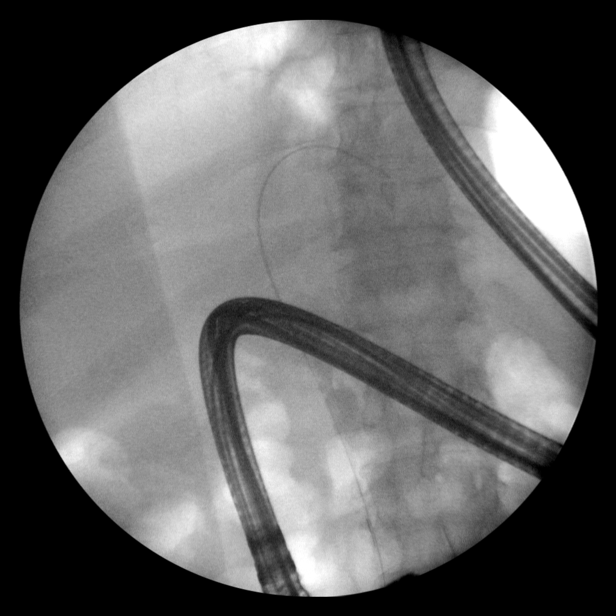
[im 4/21]
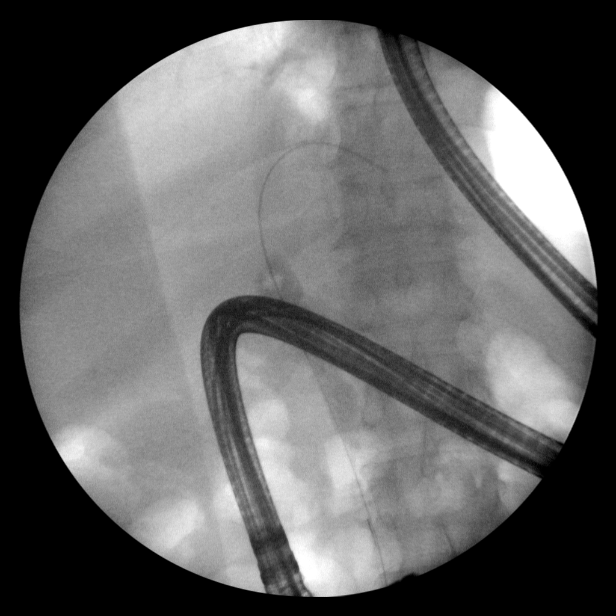
[im 6/21]
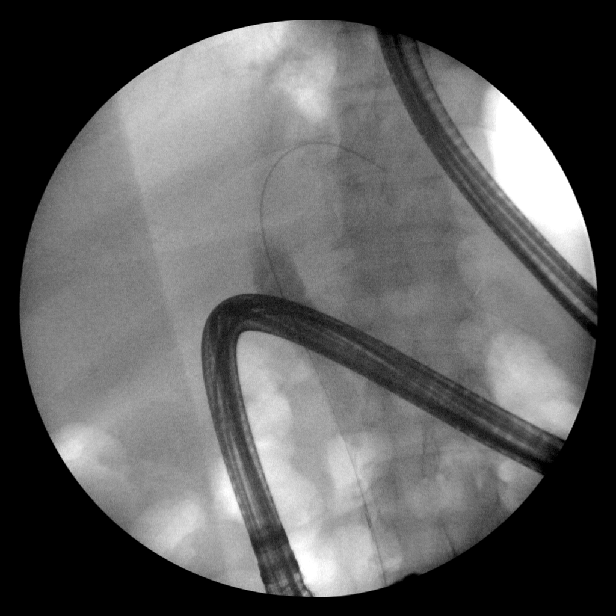
[im 7/21]
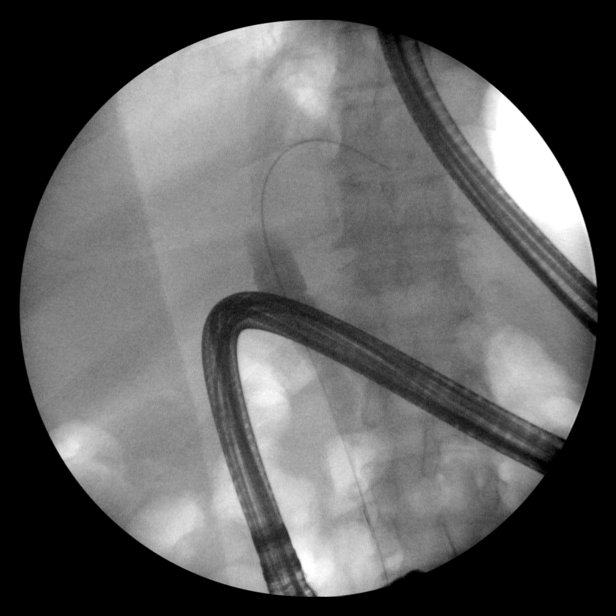
[im 9/21]
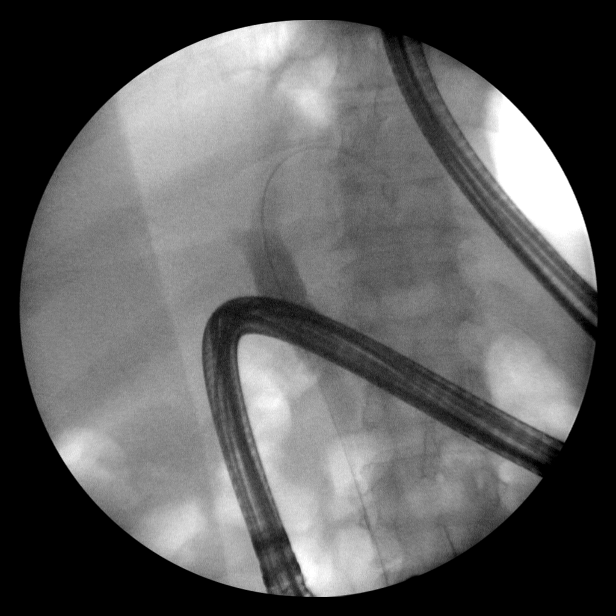
[im 10/21]
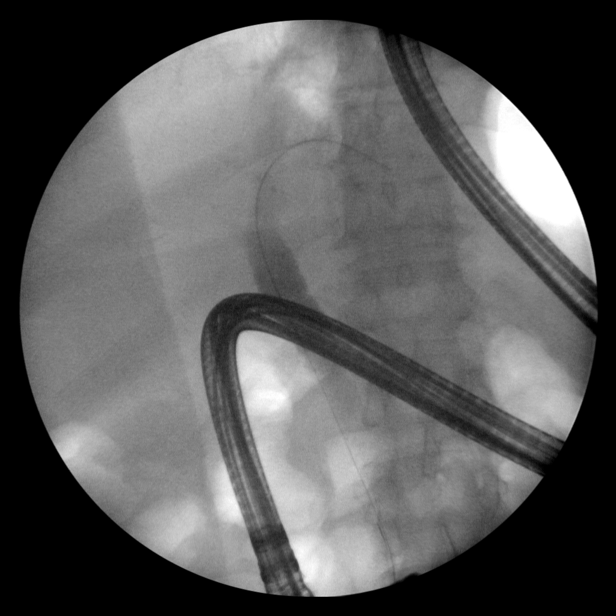
[im 12/21]
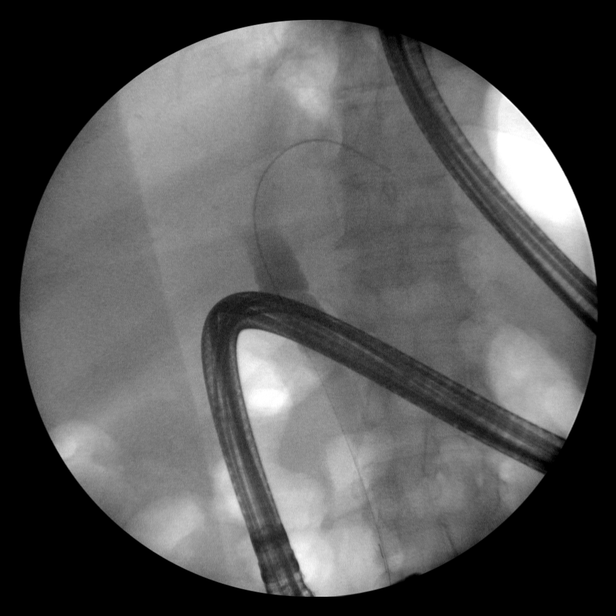
[im 13/21]
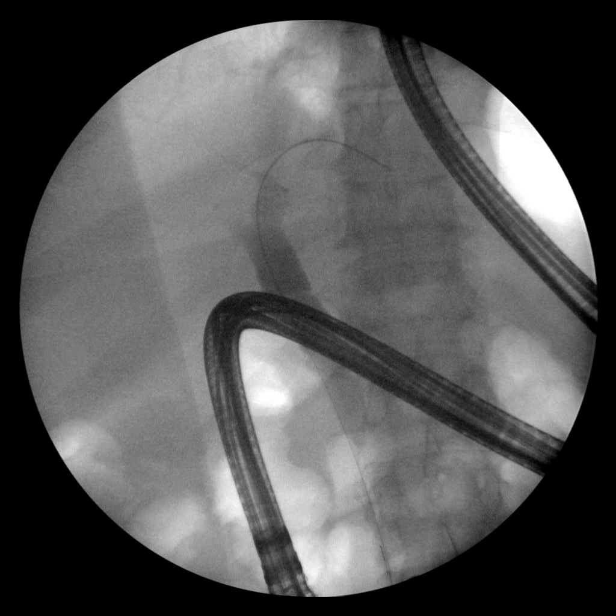
[im 15/21]
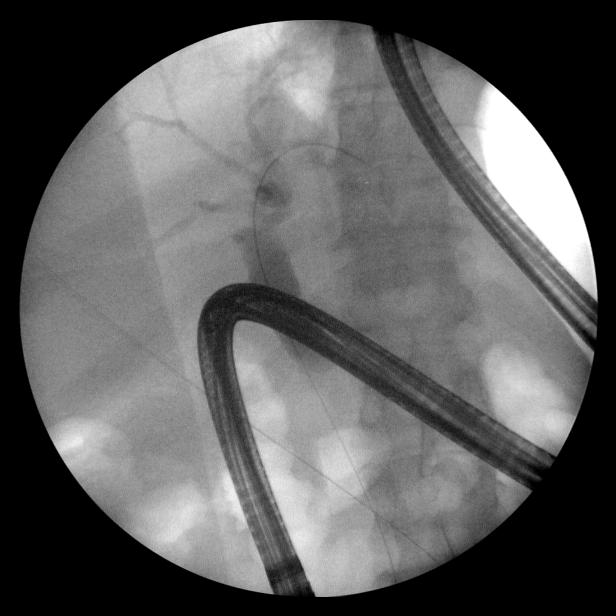
[im 16/21]
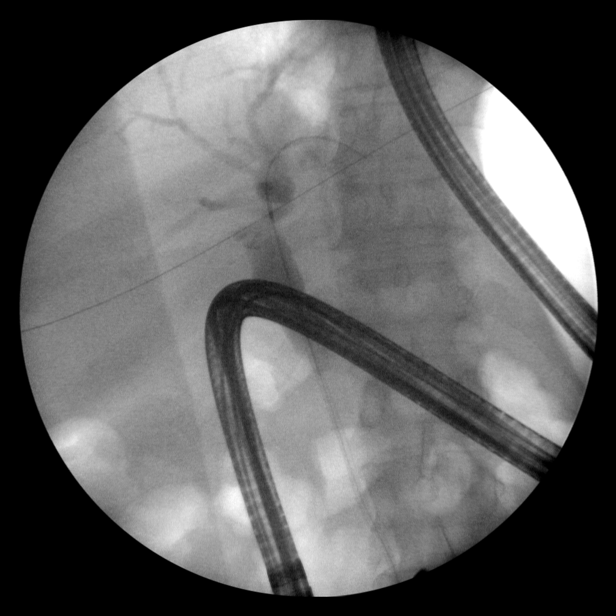
[im 18/21]
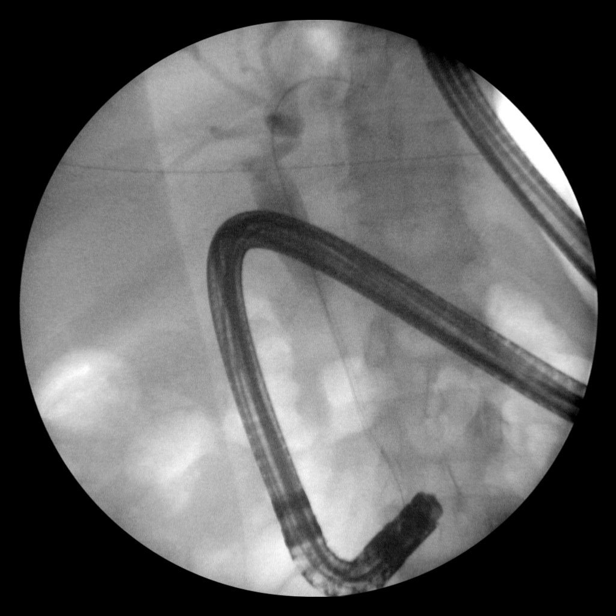
[im 19/21]
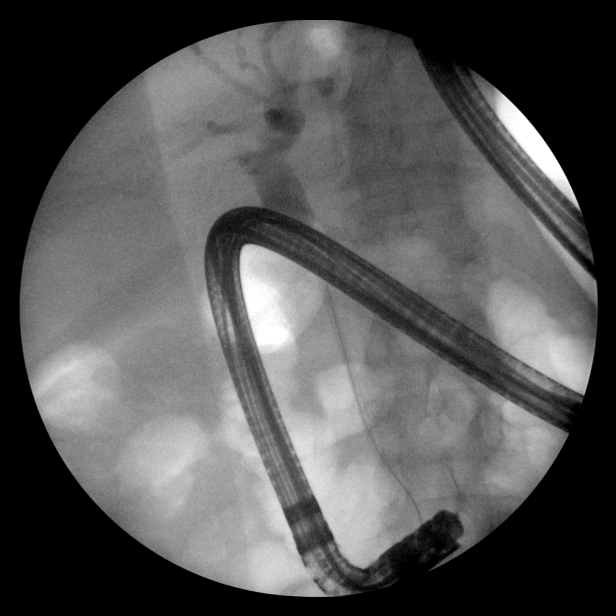
[im 21/21]
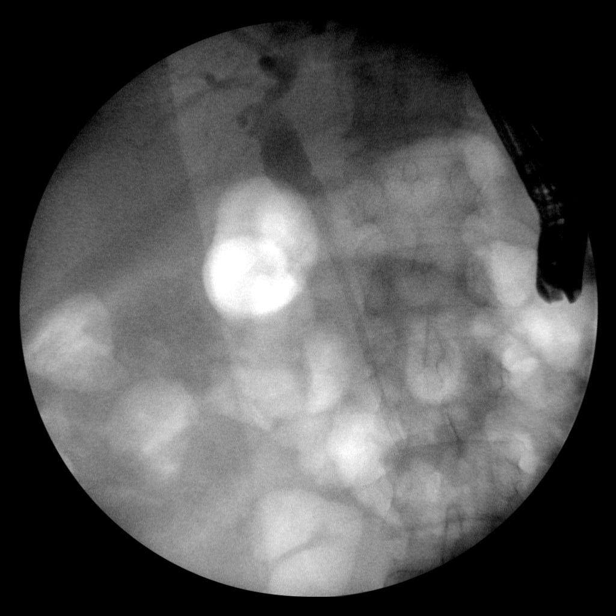

[14 of 21 positions shown; findings below may reference images not displayed]

FINDINGS: On initial injection, there is transient ovoid filling defect at the
mid portion of the dilated common duct, see for example image
labeled 4. This could be due to flow phenomenon although the
presence of stones, sludge, or other material could also account for
this appearance. There is no focal caliber abnormality of the common
duct. A plastic stent was placed. Contrast is not seen within the
duodenum. Mild intrahepatic ductal dilatation is also identified.
IMPRESSION: Early appearance of mid common duct filling defect which could
represent inflow phenomenon although a stone, sludge, or other
material could also account for this appearance.

Common and intrahepatic ductal dilatation reidentified, with
placement of a plastic stent.

These images were submitted for radiologic interpretation only.
Please see the procedural report for the amount of contrast and the
fluoroscopy time utilized.
